# Patient Record
Sex: Male | Born: 1965
Health system: Southern US, Community
[De-identification: ages and names within clinical notes are randomized; demographics above are authoritative.]

## PROBLEM LIST (undated history)

## (undated) DIAGNOSIS — I10 Essential (primary) hypertension: Secondary | ICD-10-CM

## (undated) DIAGNOSIS — F419 Anxiety disorder, unspecified: Secondary | ICD-10-CM

## (undated) DIAGNOSIS — G44009 Cluster headache syndrome, unspecified, not intractable: Secondary | ICD-10-CM

## (undated) DIAGNOSIS — R251 Tremor, unspecified: Secondary | ICD-10-CM

## (undated) HISTORY — PX: WISDOM TOOTH EXTRACTION: SHX21

## (undated) HISTORY — DX: Cluster headache syndrome, unspecified, not intractable: G44.009

## (undated) HISTORY — PX: FINGER SURGERY: SHX640

## (undated) HISTORY — DX: Tremor, unspecified: R25.1

## (undated) HISTORY — DX: Anxiety disorder, unspecified: F41.9

## (undated) HISTORY — PX: VASECTOMY: SHX75

---

## 1998-02-21 ENCOUNTER — Emergency Department (HOSPITAL_COMMUNITY): Admission: EM | Admit: 1998-02-21 | Discharge: 1998-02-21 | Payer: Self-pay | Admitting: Endocrinology

## 1998-12-12 ENCOUNTER — Emergency Department (HOSPITAL_COMMUNITY): Admission: EM | Admit: 1998-12-12 | Discharge: 1998-12-12 | Payer: Self-pay | Admitting: Emergency Medicine

## 1999-02-05 ENCOUNTER — Emergency Department (HOSPITAL_COMMUNITY): Admission: EM | Admit: 1999-02-05 | Discharge: 1999-02-05 | Payer: Self-pay

## 1999-02-11 ENCOUNTER — Emergency Department (HOSPITAL_COMMUNITY): Admission: EM | Admit: 1999-02-11 | Discharge: 1999-02-11 | Payer: Self-pay | Admitting: Emergency Medicine

## 1999-02-16 ENCOUNTER — Emergency Department (HOSPITAL_COMMUNITY): Admission: EM | Admit: 1999-02-16 | Discharge: 1999-02-16 | Payer: Self-pay | Admitting: Emergency Medicine

## 2000-01-05 ENCOUNTER — Emergency Department (HOSPITAL_COMMUNITY): Admission: EM | Admit: 2000-01-05 | Discharge: 2000-01-05 | Payer: Self-pay | Admitting: Emergency Medicine

## 2000-04-15 ENCOUNTER — Emergency Department (HOSPITAL_COMMUNITY): Admission: EM | Admit: 2000-04-15 | Discharge: 2000-04-15 | Payer: Self-pay | Admitting: *Deleted

## 2000-06-15 ENCOUNTER — Emergency Department (HOSPITAL_COMMUNITY): Admission: EM | Admit: 2000-06-15 | Discharge: 2000-06-15 | Payer: Self-pay | Admitting: Emergency Medicine

## 2000-06-15 ENCOUNTER — Encounter: Payer: Self-pay | Admitting: Emergency Medicine

## 2000-08-24 ENCOUNTER — Ambulatory Visit (HOSPITAL_COMMUNITY): Admission: RE | Admit: 2000-08-24 | Discharge: 2000-08-24 | Payer: Self-pay | Admitting: Chiropractic Medicine

## 2000-08-24 ENCOUNTER — Encounter: Payer: Self-pay | Admitting: Chiropractic Medicine

## 2002-03-22 ENCOUNTER — Emergency Department (HOSPITAL_COMMUNITY): Admission: EM | Admit: 2002-03-22 | Discharge: 2002-03-22 | Payer: Self-pay | Admitting: *Deleted

## 2002-03-22 ENCOUNTER — Encounter: Payer: Self-pay | Admitting: *Deleted

## 2004-04-27 ENCOUNTER — Emergency Department (HOSPITAL_COMMUNITY): Admission: EM | Admit: 2004-04-27 | Discharge: 2004-04-28 | Payer: Self-pay | Admitting: Emergency Medicine

## 2004-09-11 ENCOUNTER — Ambulatory Visit: Payer: Self-pay | Admitting: Internal Medicine

## 2005-01-13 ENCOUNTER — Ambulatory Visit: Payer: Self-pay | Admitting: Internal Medicine

## 2005-06-07 ENCOUNTER — Ambulatory Visit: Payer: Self-pay | Admitting: Family Medicine

## 2005-06-23 ENCOUNTER — Ambulatory Visit: Payer: Self-pay | Admitting: Family Medicine

## 2005-09-22 ENCOUNTER — Ambulatory Visit: Payer: Self-pay | Admitting: Family Medicine

## 2006-04-22 ENCOUNTER — Emergency Department (HOSPITAL_COMMUNITY): Admission: EM | Admit: 2006-04-22 | Discharge: 2006-04-22 | Payer: Self-pay | Admitting: Emergency Medicine

## 2008-11-27 ENCOUNTER — Ambulatory Visit (HOSPITAL_COMMUNITY): Admission: RE | Admit: 2008-11-27 | Discharge: 2008-11-27 | Payer: Self-pay | Admitting: Family Medicine

## 2008-12-23 ENCOUNTER — Ambulatory Visit (HOSPITAL_COMMUNITY): Admission: RE | Admit: 2008-12-23 | Discharge: 2008-12-23 | Payer: Self-pay | Admitting: Specialist

## 2009-05-07 ENCOUNTER — Ambulatory Visit (HOSPITAL_COMMUNITY): Admission: RE | Admit: 2009-05-07 | Discharge: 2009-05-07 | Payer: Self-pay | Admitting: Specialist

## 2009-06-25 ENCOUNTER — Emergency Department (HOSPITAL_COMMUNITY): Admission: EM | Admit: 2009-06-25 | Discharge: 2009-06-25 | Payer: Self-pay | Admitting: Family Medicine

## 2009-08-26 ENCOUNTER — Emergency Department (HOSPITAL_COMMUNITY): Admission: EM | Admit: 2009-08-26 | Discharge: 2009-08-26 | Payer: Self-pay | Admitting: Family Medicine

## 2009-12-11 ENCOUNTER — Ambulatory Visit (HOSPITAL_COMMUNITY): Admission: RE | Admit: 2009-12-11 | Discharge: 2009-12-11 | Payer: Self-pay | Admitting: Gastroenterology

## 2010-05-28 ENCOUNTER — Emergency Department (HOSPITAL_COMMUNITY)
Admission: EM | Admit: 2010-05-28 | Discharge: 2010-05-28 | Payer: Self-pay | Source: Home / Self Care | Admitting: Family Medicine

## 2010-08-30 ENCOUNTER — Encounter: Payer: Self-pay | Admitting: Internal Medicine

## 2010-10-26 LAB — DIFFERENTIAL
Basophils Absolute: 0.1 10*3/uL (ref 0.0–0.1)
Basophils Relative: 1 % (ref 0–1)
Eosinophils Absolute: 0.2 10*3/uL (ref 0.0–0.7)
Eosinophils Relative: 2 % (ref 0–5)
Monocytes Absolute: 0.8 10*3/uL (ref 0.1–1.0)
Monocytes Relative: 9 % (ref 3–12)
Neutro Abs: 6 10*3/uL (ref 1.7–7.7)

## 2010-10-26 LAB — COMPREHENSIVE METABOLIC PANEL
ALT: 43 U/L (ref 0–53)
AST: 48 U/L — ABNORMAL HIGH (ref 0–37)
Albumin: 4.3 g/dL (ref 3.5–5.2)
Alkaline Phosphatase: 56 U/L (ref 39–117)
Chloride: 100 mEq/L (ref 96–112)
GFR calc Af Amer: >60 mL/min (ref >60)
Potassium: 4.4 mEq/L (ref 3.5–5.1)
Sodium: 140 mEq/L (ref 135–145)
Total Bilirubin: 1.4 mg/dL — ABNORMAL HIGH (ref 0.3–1.2)
Total Protein: 7.7 g/dL (ref 6.0–8.3)

## 2010-10-26 LAB — POCT H PYLORI SCREEN: H. PYLORI SCREEN, POC: NEGATIVE

## 2010-10-26 LAB — CBC
HCT: 48.6 % (ref 39.0–52.0)
Platelets: 193 10*3/uL (ref 150–400)
RDW: 13.2 % (ref 11.5–15.5)
WBC: 8.8 10*3/uL (ref 4.0–10.5)

## 2011-02-02 ENCOUNTER — Other Ambulatory Visit (HOSPITAL_COMMUNITY): Payer: Self-pay | Admitting: Family Medicine

## 2011-02-02 DIAGNOSIS — IMO0002 Reserved for concepts with insufficient information to code with codable children: Secondary | ICD-10-CM

## 2011-02-03 ENCOUNTER — Inpatient Hospital Stay (HOSPITAL_COMMUNITY)
Admission: RE | Admit: 2011-02-03 | Discharge: 2011-02-03 | Payer: Self-pay | Source: Ambulatory Visit | Attending: Family Medicine | Admitting: Family Medicine

## 2011-02-11 ENCOUNTER — Ambulatory Visit (HOSPITAL_COMMUNITY)
Admission: RE | Admit: 2011-02-11 | Discharge: 2011-02-11 | Disposition: A | Discharge status: Home / Self Care | Payer: 59 | Source: Ambulatory Visit | Attending: Family Medicine | Admitting: Family Medicine

## 2011-02-11 DIAGNOSIS — M5126 Other intervertebral disc displacement, lumbar region: Secondary | ICD-10-CM | POA: Insufficient documentation

## 2011-02-11 DIAGNOSIS — M545 Low back pain, unspecified: Secondary | ICD-10-CM | POA: Insufficient documentation

## 2011-02-11 DIAGNOSIS — M519 Unspecified thoracic, thoracolumbar and lumbosacral intervertebral disc disorder: Secondary | ICD-10-CM | POA: Insufficient documentation

## 2011-02-11 DIAGNOSIS — M79609 Pain in unspecified limb: Secondary | ICD-10-CM | POA: Insufficient documentation

## 2011-02-11 DIAGNOSIS — M51379 Other intervertebral disc degeneration, lumbosacral region without mention of lumbar back pain or lower extremity pain: Secondary | ICD-10-CM | POA: Insufficient documentation

## 2011-02-11 DIAGNOSIS — IMO0002 Reserved for concepts with insufficient information to code with codable children: Secondary | ICD-10-CM

## 2011-02-11 DIAGNOSIS — M5137 Other intervertebral disc degeneration, lumbosacral region: Secondary | ICD-10-CM | POA: Insufficient documentation

## 2012-12-03 ENCOUNTER — Encounter (HOSPITAL_COMMUNITY): Payer: Self-pay | Admitting: Emergency Medicine

## 2012-12-03 ENCOUNTER — Emergency Department (HOSPITAL_COMMUNITY)
Admission: EM | Admit: 2012-12-03 | Discharge: 2012-12-03 | Disposition: A | Discharge status: Home / Self Care | Payer: 59 | Source: Home / Self Care

## 2012-12-03 DIAGNOSIS — N509 Disorder of male genital organs, unspecified: Secondary | ICD-10-CM

## 2012-12-03 DIAGNOSIS — N50812 Left testicular pain: Secondary | ICD-10-CM

## 2012-12-03 DIAGNOSIS — N433 Hydrocele, unspecified: Secondary | ICD-10-CM

## 2012-12-03 HISTORY — DX: Essential (primary) hypertension: I10

## 2012-12-03 LAB — POCT URINALYSIS DIP (DEVICE)
Bilirubin Urine: NEGATIVE
Hgb urine dipstick: NEGATIVE
Leukocytes, UA: NEGATIVE
Nitrite: NEGATIVE
Protein, ur: NEGATIVE mg/dL
pH: 6 (ref 5.0–8.0)

## 2012-12-03 MED ORDER — CIPROFLOXACIN HCL 500 MG PO TABS: 500.0000 mg | ORAL_TABLET | Freq: Two times a day (BID) | ORAL | Status: DC

## 2012-12-03 MED ORDER — TRAMADOL HCL 50 MG PO TABS: 50.0000 mg | ORAL_TABLET | Freq: Four times a day (QID) | ORAL | Status: DC | PRN

## 2012-12-03 NOTE — ED Provider Notes (Signed)
History     CSN: [id number]  Arrival date & time 12/03/12  1148   None     Chief Complaint  Patient presents with  . Testicle Pain    left testicle pain with swelling. denies injury.     (Consider location/radiation/quality/duration/timing/severity/associated sxs/prior treatment) HPI Comments: 47 year old male presents with swelling and mild pain to the left testicle. It began approximately 2 days ago he denies trauma or known injury. He denies urinary symptoms or fever. It is sometimes worse when walking and touching the left testicle.   Past Medical History  Diagnosis Date  . Hypertension     Past Surgical History  Procedure Laterality Date  . Vasectomy      History reviewed. No pertinent family history.  History  Substance Use Topics  . Smoking status: Never Smoker   . Smokeless tobacco: Not on file  . Alcohol Use: No      Review of Systems  Constitutional: Negative.   Respiratory: Negative.   Gastrointestinal: Negative.   Genitourinary: Positive for testicular pain. Negative for dysuria, frequency, hematuria, flank pain, discharge, penile swelling, scrotal swelling, difficulty urinating and penile pain.  Neurological: Negative.     Allergies  Review of patient's allergies indicates no known allergies.  Home Medications   Current Outpatient Rx  Name  Route  Sig  Dispense  Refill  . ClonazePAM (KLONOPIN PO)   Oral   Take by mouth.         Marland Kitchen LISINOPRIL PO   Oral   Take by mouth.         . Multiple Vitamins-Minerals (MULTIVITAMIN PO)   Oral   Take by mouth.         . ciprofloxacin (CIPRO) 500 MG tablet   Oral   Take 1 tablet (500 mg total) by mouth 2 (two) times daily.   20 tablet   0   . traMADol (ULTRAM) 50 MG tablet   Oral   Take 1 tablet (50 mg total) by mouth every 6 (six) hours as needed for pain.   15 tablet   0     There were no vitals taken for this visit.  Physical Exam  Nursing note and vitals  reviewed. Constitutional: He is oriented to person, place, and time. He appears well-developed and well-nourished.  Pulmonary/Chest: Effort normal.  Abdominal: Soft.  Genitourinary:  Penis is normal. Scrotum intact both testicles descended. The right testicle is of normal size and without pain or tenderness. Left testicle has a firm nodule-like structure in the inferior pole which is tender.. The testicle itself is nontender. There is no pain in the penis. There is no discoloration of the scrotum.  Neurological: He is alert and oriented to person, place, and time. He exhibits normal muscle tone.  Skin: Skin is warm and dry.    ED Course  Procedures (including critical care time)  Labs Reviewed  POCT URINALYSIS DIP (DEVICE)   No results found.   1. Hydrocele, right   2. Testicular pain, left       MDM  There is tenderness at the inferior pole of the left testicle with an additional structure was likely representing a hydrocele. No evidence of decreased blood supply based on hands on evaluation and history. We will treat with Cipro 500 mg twice a day and he will followup with his urologist tomorrow. 3 new symptoms problems or worsening may return.         Hayden Rasmussen, NP 12/03/12 1402

## 2012-12-03 NOTE — ED Provider Notes (Signed)
Medical screening examination/treatment/procedure(s) were performed by resident physician or non-physician practitioner and as supervising physician I was immediately available for consultation/collaboration.   KINDL,JAMES DOUGLAS MD.   James D Kindl, MD 12/03/12 1808 

## 2012-12-03 NOTE — ED Notes (Signed)
Pt c/o left testicle pain with swelling x 2 days. Pt states that this has happened before a while back but did not last long. Pt denies injury and or urinary symptoms. Pt has used ibuprofen with no relief in symptoms.

## 2013-02-20 ENCOUNTER — Encounter: Payer: Self-pay | Admitting: Diagnostic Neuroimaging

## 2013-02-20 ENCOUNTER — Ambulatory Visit (INDEPENDENT_AMBULATORY_CARE_PROVIDER_SITE_OTHER): Payer: 59 | Admitting: Diagnostic Neuroimaging

## 2013-02-20 VITALS — BP 114/82 | HR 60 | Temp 97.8°F | Ht 68.0 in | Wt 176.0 lb

## 2013-02-20 DIAGNOSIS — R269 Unspecified abnormalities of gait and mobility: Secondary | ICD-10-CM

## 2013-02-20 DIAGNOSIS — G249 Dystonia, unspecified: Secondary | ICD-10-CM

## 2013-02-20 DIAGNOSIS — R259 Unspecified abnormal involuntary movements: Secondary | ICD-10-CM

## 2013-02-20 NOTE — Patient Instructions (Signed)
I will order MRIs.  Please obtain prior neurology records and test results and bring them to our office.

## 2013-02-20 NOTE — Progress Notes (Signed)
[facility]  PATIENT: Joel Roberts DOB: [birth year]  REFERRING CLINICIAN: Virl Cagey HISTORY FROM: patient REASON FOR VISIT: new consult   HISTORICAL  CHIEF COMPLAINT:  Chief Complaint  Patient presents with  . Tremors  . Fall  . Extremity Weakness    legs    HISTORY OF PRESENT ILLNESS:   47 year old right-handed male here for evaluation of tremor and gait difficulty. For past 4 years patient has had gradual onset, progressive right upper extremity tremor, balance and walking difficulty, falling down. Tremor is mainly present at rest. Tremor initially involved right arm and right leg. Now tremor his involved left side as well. Patient was evaluated in [place], [place] by a neurologist, and then referred to [facility] movement disorder clinic for evaluation (Dr. Seward Meth). Unfortunately I do not have notes or information from these prior neurologic evaluations. Apparently patient had MRI of the brain and lumbar spine, EMG nerve conduction study, DAT scan, as well as other blood testing, including evaluation for Wilson's disease, and given diagnosis of "adult onset dystonia". Patient was tried on trial of carbidopa levodopa without benefit.   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 Recent brain imaging studies demonstrate chronic communicating hydrocephalus.  Patient and his wife are here today looking for a third opinion neuro evaluation.  No family history of similar symptoms.  REVIEW OF SYSTEMS: Full 14 system review of systems performed and notable only for fatigue ringing in ears feeling cold joint pain aching muscle anxiety tremor weakness numbness confusion memory loss.  ALLERGIES: No Known Allergies  HOME MEDICATIONS: Outpatient Prescriptions Prior to Visit  Medication Sig Dispense Refill  . ClonazePAM (KLONOPIN PO) Take by mouth.      Marland Kitchen LISINOPRIL PO Take by mouth.      . Multiple  Vitamins-Minerals (MULTIVITAMIN PO) Take by mouth.      . traMADol (ULTRAM) 50 MG tablet Take 1 tablet (50 mg total) by mouth every 6 (six) hours as needed for pain.  15 tablet  0  . ciprofloxacin (CIPRO) 500 MG tablet Take 1 tablet (500 mg total) by mouth 2 (two) times daily.  20 tablet  0   No facility-administered medications prior to visit.    PAST MEDICAL HISTORY: Past Medical History  Diagnosis Date  . Hypertension     PAST SURGICAL HISTORY: Past Surgical History  Procedure Laterality Date  . Vasectomy      FAMILY HISTORY: Family History  Problem Relation Age of Onset  . Diabetes Mother     SOCIAL HISTORY:  History   Social History  . Marital Status: Married    Spouse Name: French Ana    Number of Children: 1  . Years of Education: College   Occupational History  .  [employer]   Social History Main Topics  . Smoking status: Never Smoker   . Smokeless tobacco: Never Used  . Alcohol Use: No  . Drug Use: No  . Sexually Active: Yes    Birth Control/ Protection: Condom   Other Topics Concern  . Not on file   Social History Narrative   Pt lives at home family.   Caffeine Use: 3 cups daily.     PHYSICAL EXAM  Filed Vitals:   02/20/13 1010  BP: 114/82  Pulse: 60  Temp: 97.8 F (36.6 C)  TempSrc: Oral  Height: 5\' 8"  (1.727 m)  Weight: 176 lb (79.833 kg)    Not recorded    Body  mass index is 26.77 kg/(m^2).  GENERAL EXAM: Patient is in no distress  CARDIOVASCULAR: Regular rate and rhythm, no murmurs, no carotid bruits  NEUROLOGIC: MENTAL STATUS: awake, alert, language fluent, comprehension intact, naming intact CRANIAL NERVE: no papilledema on fundoscopic exam, pupils equal and reactive to light, visual fields full to confrontation, extraocular muscles intact, no nystagmus, facial sensation and strength symmetric, uvula midline, shoulder shrug symmetric, tongue midline. MOTOR: BUE DYSTONIA IN HANDS AND FOREARMS; INT RESTING TREMOR OF BUE. MILD  RIGIDITY; NO BRADYKINESIA. MILD POSTURAL TREMOR RUE > LUE. Full strength in the BUE, BLE SENSORY: normal and symmetric to light touch, pinprick, temperature, vibration COORDINATION: finger-nose-finger, fine finger movements normal REFLEXES: deep tendon reflexes present and symmetric GAIT/STATION: narrow based gait; ABNORMAL, STIFF GAIT. DIFF WITH TOE, HEEL AND TANDEM. TERMOR IN LUE WITH WALKING, WHICH PROGRESSES TO DYSTONIC POSTURING. Romberg is negative   DIAGNOSTIC DATA (LABS, IMAGING, TESTING) - I reviewed patient records, labs, notes, testing and imaging myself where available.  Lab Results  Component Value Date   WBC 8.8 08/26/2009   HGB 16.6 08/26/2009   HCT 48.6 08/26/2009   MCV 95.9 08/26/2009   PLT 193 08/26/2009      Component Value Date/Time   NA 140 08/26/2009 1738   K 4.4 08/26/2009 1738   CL 100 08/26/2009 1738   CO2 34* 08/26/2009 1738   GLUCOSE 89 08/26/2009 1738   BUN 15 08/26/2009 1738   CREATININE 0.95 08/26/2009 1738   CALCIUM 9.3 08/26/2009 1738   PROT 7.7 08/26/2009 1738   ALBUMIN 4.3 08/26/2009 1738   AST 48* 08/26/2009 1738   ALT 43 08/26/2009 1738   ALKPHOS 56 08/26/2009 1738   BILITOT 1.4* 08/26/2009 1738   GFRNONAA >60 08/26/2009 1738   GFRAA  Value: >60        The eGFR has been calculated using the MDRD equation. This calculation has not been validated in all clinical situations. eGFR's persistently <60 mL/min signify possible Chronic Kidney Disease. 08/26/2009 1738   No results found for this basename: CHOL, HDL, LDLCALC, LDLDIRECT, TRIG, CHOLHDL   No results found for this basename: HGBA1C   No results found for this basename: VITAMINB12   No results found for this basename: TSH    12/23/08 MRI brain - moderate ventriculomegaly; scattered non-specific gliosis  02/11/11 MRI lumbar spine - minimal degenerative changes at L3-4 and L5-S1; no spinal stenosis or foraminal narrowing    ASSESSMENT AND PLAN  47 y.o. year old male  has a past medical history of  Hypertension. here with gradual onset progressive resting tremor, dystonic posturing of bilateral upper extremities, gait and balance difficulty. May represent a genetic dystonia syndrome. I will request records of prior evaluation. I will repeat imaging studies. May consider testing for autoimmune, paraneoplastic, metabolic, genetic causes of dystonia and tremor.  PLAN: 1. MRI brain and c-spine 2. Review prior records (patient has already seen a movement disorder's specialist, Dr. Raquel Sarna who is Clinical Director Movement Disorders at [facility])   Orders Placed This Encounter  Procedures  . MR Brain W Wo Contrast  . MR Cervical Spine W Wo Contrast     Suanne Marker, MD 02/20/2013, 11:39 AM Certified in Neurology, Neurophysiology and Neuroimaging  [facility] [address] [place], [place] [address] [phone]

## 2013-05-01 ENCOUNTER — Other Ambulatory Visit (HOSPITAL_COMMUNITY): Payer: Self-pay | Admitting: Urology

## 2013-05-01 DIAGNOSIS — D4959 Neoplasm of unspecified behavior of other genitourinary organ: Secondary | ICD-10-CM

## 2013-05-22 ENCOUNTER — Ambulatory Visit: Payer: 59 | Admitting: Diagnostic Neuroimaging

## 2013-05-25 ENCOUNTER — Ambulatory Visit: Payer: 59 | Admitting: Diagnostic Neuroimaging

## 2013-06-05 ENCOUNTER — Other Ambulatory Visit (HOSPITAL_COMMUNITY): Payer: 59

## 2013-09-02 ENCOUNTER — Emergency Department (HOSPITAL_COMMUNITY)
Admission: EM | Admit: 2013-09-02 | Discharge: 2013-09-02 | Disposition: A | Discharge status: Home / Self Care | Payer: 59 | Source: Home / Self Care | Attending: Family Medicine | Admitting: Family Medicine

## 2013-09-02 ENCOUNTER — Encounter (HOSPITAL_COMMUNITY): Payer: Self-pay | Admitting: Emergency Medicine

## 2013-09-02 DIAGNOSIS — G249 Dystonia, unspecified: Secondary | ICD-10-CM

## 2013-09-02 DIAGNOSIS — R259 Unspecified abnormal involuntary movements: Secondary | ICD-10-CM

## 2013-09-02 DIAGNOSIS — G248 Other dystonia: Secondary | ICD-10-CM

## 2013-09-02 MED ORDER — KETOROLAC TROMETHAMINE 10 MG PO TABS: 10.0000 mg | ORAL_TABLET | Freq: Four times a day (QID) | ORAL | Status: DC | PRN

## 2013-09-02 MED ORDER — KETOROLAC TROMETHAMINE 30 MG/ML IJ SOLN
30.0000 mg | Freq: Once | INTRAMUSCULAR | Status: AC
  Administered 2013-09-02: 30 mg via INTRAMUSCULAR

## 2013-09-02 MED ORDER — KETOROLAC TROMETHAMINE 30 MG/ML IJ SOLN
INTRAMUSCULAR | Status: AC
  Filled 2013-09-02: qty 1

## 2013-09-02 NOTE — Discharge Instructions (Signed)
Contact your doctor on mon for further care plans.

## 2013-09-02 NOTE — ED Notes (Signed)
Pt  Reports  Back  Pain  Lower  In nature           He also   Reports  Some weakness  In his  Lower  Back  And  Has  Been  Falling a  Lot  Recently          He  States  He  Has  A  Bulging  discc in his  Back            Pain is  Worse  When he  Moves    He states  He  [address]  2  Days  Ago         He  States  He  Sees  A  Garment/textile technologist  At  [employer]

## 2013-09-02 NOTE — ED Provider Notes (Signed)
CSN: [id number]     Arrival date & time 09/02/13  1329 History   First MD Initiated Contact with Patient 09/02/13 1433     Chief Complaint  Patient presents with  . Back Pain   (Consider location/radiation/quality/duration/timing/severity/associated sxs/prior Treatment) Patient is a 48 y.o. male presenting with back pain. The history is provided by the patient.  Back Pain Location:  Lumbar spine Quality:  Stabbing Pain severity:  Moderate Chronicity:  Chronic (last seen by [facility] neurologist 36mos ago.) Context comment:  Dx with dystonia, falling spells, undx'd by 3 neurologists in [place] or [place] or [facility]. fell 2 d ago, wanting referral to dr Astrid Drafts., [facility]. Associated symptoms: weakness   Associated symptoms: no abdominal pain, no abdominal swelling, no bladder incontinence and no bowel incontinence     Past Medical History  Diagnosis Date  . Hypertension    Past Surgical History  Procedure Laterality Date  . Vasectomy     Family History  Problem Relation Age of Onset  . Diabetes Mother    History  Substance Use Topics  . Smoking status: Never Smoker   . Smokeless tobacco: Never Used  . Alcohol Use: No    Review of Systems  Gastrointestinal: Negative.  Negative for abdominal pain and bowel incontinence.  Genitourinary: Negative.  Negative for bladder incontinence.  Musculoskeletal: Positive for back pain and gait problem.  Skin: Negative.   Neurological: Positive for weakness.    Allergies  Review of patient's allergies indicates no known allergies.  Home Medications   Current Outpatient Rx  Name  Route  Sig  Dispense  Refill  . ClonazePAM (KLONOPIN PO)   Oral   Take by mouth.         Marland Kitchen ketorolac (TORADOL) 10 MG tablet   Oral   Take 1 tablet (10 mg total) by mouth every 6 (six) hours as needed for moderate pain.   20 tablet   0   . LISINOPRIL PO   Oral   Take by mouth.         . Multiple Vitamins-Minerals (MULTIVITAMIN PO)  Oral   Take by mouth.         . traMADol (ULTRAM) 50 MG tablet   Oral   Take 1 tablet (50 mg total) by mouth every 6 (six) hours as needed for pain.   15 tablet   0    BP 132/78  Pulse 76  Temp(Src) 97.2 F (36.2 C) (Oral)  Resp 18  SpO2 100% Physical Exam  Nursing note and vitals reviewed. Constitutional: He is oriented to person, place, and time. He appears well-developed and well-nourished.  Musculoskeletal:       Lumbar back: He exhibits tenderness. He exhibits normal range of motion, no pain and no spasm.  Neurological: He is alert and oriented to person, place, and time. No cranial nerve deficit. Coordination normal.  Skin: Skin is warm and dry.    ED Course  Procedures (including critical care time) Labs Review Labs Reviewed - No data to display Imaging Review No results found.  EKG Interpretation    Date/Time:    Ventricular Rate:    PR Interval:    QRS Duration:   QT Interval:    QTC Calculation:   R Axis:     Text Interpretation:              MDM      Billy Fischer, MD 09/02/13 1527

## 2013-09-12 ENCOUNTER — Telehealth: Payer: Self-pay | Admitting: Neurology

## 2013-09-12 NOTE — Telephone Encounter (Signed)
Spoke with patient's wife. MR Brain was last done in 2012.

## 2013-09-14 ENCOUNTER — Encounter: Payer: Self-pay | Admitting: Neurology

## 2013-09-14 ENCOUNTER — Ambulatory Visit (INDEPENDENT_AMBULATORY_CARE_PROVIDER_SITE_OTHER): Payer: 59 | Admitting: Neurology

## 2013-09-14 VITALS — BP 154/88 | HR 64 | Resp 16 | Ht 69.0 in | Wt 179.2 lb

## 2013-09-14 DIAGNOSIS — F449 Dissociative and conversion disorder, unspecified: Secondary | ICD-10-CM

## 2013-09-14 DIAGNOSIS — G252 Other specified forms of tremor: Secondary | ICD-10-CM

## 2013-09-14 DIAGNOSIS — R259 Unspecified abnormal involuntary movements: Secondary | ICD-10-CM

## 2013-09-14 DIAGNOSIS — R269 Unspecified abnormalities of gait and mobility: Secondary | ICD-10-CM

## 2013-09-14 NOTE — Patient Instructions (Signed)
We will contact you about a referral.
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
## 2013-09-20 ENCOUNTER — Telehealth: Payer: Self-pay | Admitting: Neurology

## 2013-09-20 DIAGNOSIS — F444 Conversion disorder with motor symptom or deficit: Secondary | ICD-10-CM

## 2013-09-20 NOTE — Telephone Encounter (Signed)
Patient made aware I have sent the referral and they will be calling with an appt.

## 2013-09-20 NOTE — Telephone Encounter (Signed)
Referral faxed to Dr Tobey Bride in [place] at [phone] for treatment of psychogenic tremor.

## 2013-10-12 ENCOUNTER — Other Ambulatory Visit (HOSPITAL_COMMUNITY): Payer: Self-pay | Admitting: Neurosurgery

## 2013-10-12 DIAGNOSIS — M5137 Other intervertebral disc degeneration, lumbosacral region: Secondary | ICD-10-CM

## 2013-10-17 ENCOUNTER — Telehealth: Payer: Self-pay | Admitting: Neurology

## 2013-10-17 NOTE — Telephone Encounter (Signed)
Called patient to follow up on referral to Dr Tobey Bride. I spoke with patient's wife who states they did contact him to set up an appt but they are not in network with his insurance so they did not follow through with making an appt. She will contact her insurance and see if they can find an MD covered by his insurance. She will call us back if she needs assistance from [place].

## 2013-10-22 ENCOUNTER — Ambulatory Visit (HOSPITAL_COMMUNITY): Payer: 59

## 2013-10-25 ENCOUNTER — Ambulatory Visit (HOSPITAL_COMMUNITY)
Admission: RE | Admit: 2013-10-25 | Discharge: 2013-10-25 | Disposition: A | Discharge status: Home / Self Care | Payer: 59 | Source: Ambulatory Visit | Attending: Neurosurgery | Admitting: Neurosurgery

## 2013-10-25 DIAGNOSIS — M545 Low back pain, unspecified: Secondary | ICD-10-CM | POA: Insufficient documentation

## 2013-10-25 DIAGNOSIS — M5126 Other intervertebral disc displacement, lumbar region: Secondary | ICD-10-CM | POA: Insufficient documentation

## 2013-10-25 DIAGNOSIS — M47817 Spondylosis without myelopathy or radiculopathy, lumbosacral region: Secondary | ICD-10-CM | POA: Insufficient documentation

## 2013-10-25 DIAGNOSIS — M5137 Other intervertebral disc degeneration, lumbosacral region: Secondary | ICD-10-CM

## 2014-06-22 ENCOUNTER — Emergency Department (HOSPITAL_COMMUNITY): Payer: 59

## 2014-06-22 ENCOUNTER — Encounter (HOSPITAL_COMMUNITY): Payer: Self-pay | Admitting: *Deleted

## 2014-06-22 ENCOUNTER — Emergency Department (HOSPITAL_COMMUNITY)
Admission: EM | Admit: 2014-06-22 | Discharge: 2014-06-22 | Disposition: A | Discharge status: Home / Self Care | Payer: 59 | Attending: Emergency Medicine | Admitting: Emergency Medicine

## 2014-06-22 DIAGNOSIS — S99912A Unspecified injury of left ankle, initial encounter: Secondary | ICD-10-CM | POA: Diagnosis present

## 2014-06-22 DIAGNOSIS — F419 Anxiety disorder, unspecified: Secondary | ICD-10-CM | POA: Insufficient documentation

## 2014-06-22 DIAGNOSIS — Y998 Other external cause status: Secondary | ICD-10-CM | POA: Diagnosis not present

## 2014-06-22 DIAGNOSIS — S93402A Sprain of unspecified ligament of left ankle, initial encounter: Secondary | ICD-10-CM | POA: Insufficient documentation

## 2014-06-22 DIAGNOSIS — W010XXA Fall on same level from slipping, tripping and stumbling without subsequent striking against object, initial encounter: Secondary | ICD-10-CM | POA: Insufficient documentation

## 2014-06-22 DIAGNOSIS — Y9389 Activity, other specified: Secondary | ICD-10-CM | POA: Diagnosis not present

## 2014-06-22 DIAGNOSIS — I10 Essential (primary) hypertension: Secondary | ICD-10-CM | POA: Insufficient documentation

## 2014-06-22 DIAGNOSIS — Y9289 Other specified places as the place of occurrence of the external cause: Secondary | ICD-10-CM | POA: Diagnosis not present

## 2014-06-22 DIAGNOSIS — W19XXXA Unspecified fall, initial encounter: Secondary | ICD-10-CM

## 2014-06-22 DIAGNOSIS — Z79899 Other long term (current) drug therapy: Secondary | ICD-10-CM | POA: Insufficient documentation

## 2014-06-22 MED ORDER — HYDROCODONE-ACETAMINOPHEN 5-325 MG PO TABS
2.0000 | ORAL_TABLET | Freq: Once | ORAL | Status: AC
  Administered 2014-06-22: 2 via ORAL
  Filled 2014-06-22: qty 2

## 2014-06-22 NOTE — Discharge Instructions (Signed)
°Ankle Sprain °An ankle sprain is an injury to the strong, fibrous tissues (ligaments) that hold the bones of your ankle joint together.  °CAUSES °An ankle sprain is usually caused by a fall or by twisting your ankle. Ankle sprains most commonly occur when you step on the outer edge of your foot, and your ankle turns inward. People who participate in sports are more prone to these types of injuries.  °SYMPTOMS  °· Pain in your ankle. The pain may be present at rest or only when you are trying to stand or walk. °· Swelling. °· Bruising. Bruising may develop immediately or within 1 to 2 days after your injury. °· Difficulty standing or walking, particularly when turning corners or changing directions. °DIAGNOSIS  °Your caregiver will ask you details about your injury and perform a physical exam of your ankle to determine if you have an ankle sprain. During the physical exam, your caregiver will press on and apply pressure to specific areas of your foot and ankle. Your caregiver will try to move your ankle in certain ways. An X-ray exam may be done to be sure a bone was not broken or a ligament did not separate from one of the bones in your ankle (avulsion fracture).  °TREATMENT  °Certain types of braces can help stabilize your ankle. Your caregiver can make a recommendation for this. Your caregiver may recommend the use of medicine for pain. If your sprain is severe, your caregiver may refer you to a surgeon who helps to restore function to parts of your skeletal system (orthopedist) or a physical therapist. °HOME CARE INSTRUCTIONS  °· Apply ice to your injury for 1-2 days or as directed by your caregiver. Applying ice helps to reduce inflammation and pain. °¨ Put ice in a plastic bag. °¨ Place a towel between your skin and the bag. °¨ Leave the ice on for 15-20 minutes at a time, every 2 hours while you are awake. °· Only take over-the-counter or prescription medicines for pain, discomfort, or fever as directed by  your caregiver. °· Elevate your injured ankle above the level of your heart as much as possible for 2-3 days. °· If your caregiver recommends crutches, use them as instructed. Gradually put weight on the affected ankle. Continue to use crutches or a cane until you can walk without feeling pain in your ankle. °· If you have a plaster splint, wear the splint as directed by your caregiver. Do not rest it on anything harder than a pillow for the first 24 hours. Do not put weight on it. Do not get it wet. You may take it off to take a shower or bath. °· You may have been given an elastic bandage to wear around your ankle to provide support. If the elastic bandage is too tight (you have numbness or tingling in your foot or your foot becomes cold and blue), adjust the bandage to make it comfortable. °· If you have an air splint, you may blow more air into it or let air out to make it more comfortable. You may take your splint off at night and before taking a shower or bath. Wiggle your toes in the splint several times per day to decrease swelling. °SEEK MEDICAL CARE IF:  °· You have rapidly increasing bruising or swelling. °· Your toes feel extremely cold or you lose feeling in your foot. °· Your pain is not relieved with medicine. °SEEK IMMEDIATE MEDICAL CARE IF: °· Your toes are numb or blue. °·   You have severe pain that is increasing. MAKE SURE YOU:   Understand these instructions.  Will watch your condition.  Will get help right away if you are not doing well or get worse. Document Released: 07/26/2005 Document Revised: 04/19/2012 Document Reviewed: 08/07/2011 [facility] Patient Information 2015 [place], [place]. This information is not intended to replace advice given to you by your health care provider. Make sure you discuss any questions you have with your health care provider. Elastic Bandage and RICE Elastic bandages come in different shapes and sizes. They perform different functions. Your caregiver will  help you to decide what is best for your protection, recovery, or rehabilitation following an injury. The following are some general tips to help you use an elastic bandage.  Use the bandage as directed by the maker of the bandage you are using.  Do not wrap it too tight. This may cut off the circulation of the arm or leg below the bandage.  If part of your body beyond the bandage becomes blue, numb, or swollen, it is too tight. Loosen the bandage as needed to prevent these problems.  See your caregiver or trainer if the bandage seems to be making your problems worse rather than better. Bandages may be a reminder to you that you have an injury. However, they provide very little support. The few pounds of support they provide are minor considering the pressure it takes to injure a joint or tear ligaments. Therefore, the joint will not be able to handle all of the wear and tear it could before the injury. The routine care of many injuries includes Rest, Ice, Compression, and Elevation (RICE).  Rest is required to allow your body to heal. Generally, routine activities can be resumed when comfortable. Injured tendons and bones take about 6 weeks to heal.  Icing the injury helps keep the swelling down and reduces pain. Do not apply ice directly to the skin. Put ice in a plastic bag. Place a towel between the skin and the bag. This will prevent frostbite to the skin. Apply ice bags to the injured area for 15-20 minutes, every 2 hours while awake. Do this for the first 24 to 48 hours, then as directed by your caregiver.  Compression helps keep swelling down, gives support, and helps with discomfort. If an elastic bandage has been applied today, it should be removed and reapplied every 3 to 4 hours. It should not be applied tightly, but firmly enough to keep swelling down. Watch fingers or toes for swelling, bluish discoloration, coldness, numbness, or increased pain. If any of these problems occur, remove  the bandage and reapply it more loosely. If these problems persist, contact your caregiver.  Elevation helps reduce swelling and decreases pain. The injured area (arms, hands, legs, or feet) should be placed near to or above the heart (center of the chest) if able. Persistent pain and inability to use the injured area for more than 2 to 3 days are warning signs. You should see a caregiver for a follow-up visit as soon as possible. Initially, a minor broken bone (hairline fracture) may not be seen on X-rays. It may take 7 to 10 days to finally show up. Continued pain and swelling show that further evaluation and/or X-rays are needed. Make a follow-up visit with your caregiver. A specialist in reading X-rays (radiologist) will read your X-rays again. Finding out the results of your test Not all test results are available during your visit. If your test results are not  back during the visit, make an appointment with your caregiver to find out the results. Do not assume everything is normal if you have not heard from your caregiver or the medical facility. It is important for you to follow up on all of your test results. Document Released: 01/15/2002 Document Revised: 10/18/2011 Document Reviewed: 11/27/2007 [facility] Patient Information 2015 [place], [place]. This information is not intended to replace advice given to you by your health care provider. Make sure you discuss any questions you have with your health care provider.

## 2014-06-22 NOTE — ED Provider Notes (Signed)
CSN: [id number]     Arrival date & time 06/22/14  1906 History   First MD Initiated Contact with Patient 06/22/14 1942     Chief Complaint  Patient presents with  . Ankle Injury     (Consider location/radiation/quality/duration/timing/severity/associated sxs/prior Treatment) HPI Comments: This is a 48 year old male who presents to the emergency department complaining of left ankle pain after accidentally slipping and twisting his left ankle around 6:30 PM tonight. He states he feels like his ankle is swelling. Pain is worse with any movement, slightly relieved by a Cam Walker that he had at home. No medications taken prior to arrival. Denies numbness or tingling.  Patient is a 48 y.o. male presenting with lower extremity injury. The history is provided by the patient.  Ankle Injury Pertinent negatives include no numbness.    Past Medical History  Diagnosis Date  . Hypertension   . Tremor   . Anxiety   . Cluster headaches    Past Surgical History  Procedure Laterality Date  . Vasectomy     Family History  Problem Relation Age of Onset  . Diabetes Maternal Grandmother    History  Substance Use Topics  . Smoking status: Never Smoker   . Smokeless tobacco: Never Used  . Alcohol Use: No     Comment: Hx EtOH abuse - quit drinking 7 years ago    Review of Systems  Constitutional: Negative.   HENT: Negative.   Respiratory: Negative.   Cardiovascular: Negative.   Musculoskeletal:       + L ankle pain.  Skin: Negative.   Neurological: Negative for numbness.      Allergies  Review of patient's allergies indicates no known allergies.  Home Medications   Prior to Admission medications   Medication Sig Start Date End Date Taking? Authorizing Provider  acetaminophen (TYLENOL) 500 MG tablet Take 500 mg by mouth every 6 (six) hours as needed for moderate pain.   Yes Historical Provider, MD  clonazePAM (KLONOPIN) 0.5 MG tablet Take 0.5 mg by mouth 2 (two) times daily as  needed for anxiety.   Yes Historical Provider, MD  esomeprazole (NEXIUM) 40 MG capsule Take 40 mg by mouth daily at 12 noon.   Yes Historical Provider, MD  ibuprofen (ADVIL,MOTRIN) 200 MG tablet Take 200 mg by mouth every 6 (six) hours as needed for headache.   Yes Historical Provider, MD  lisinopril (PRINIVIL,ZESTRIL) 20 MG tablet Take 20 mg by mouth daily.   Yes Historical Provider, MD  Multiple Vitamin (MULTIVITAMIN WITH MINERALS) TABS tablet Take 1 tablet by mouth daily.   Yes Historical Provider, MD   BP 145/103 mmHg  Pulse 86  Temp(Src) 98.2 F (36.8 C) (Oral)  Resp 16  Ht 5\' 9"  (1.753 m)  Wt 174 lb (78.926 kg)  BMI 25.68 kg/m2  SpO2 100% Physical Exam  Constitutional: He is oriented to person, place, and time. He appears well-developed and well-nourished. No distress.  HENT:  Head: Normocephalic and atraumatic.  Eyes: Conjunctivae and EOM are normal.  Neck: Normal range of motion. Neck supple.  Cardiovascular: Normal rate, regular rhythm and normal heart sounds.   Pulmonary/Chest: Effort normal and breath sounds normal.  Musculoskeletal:  L ankle TTP over distal fibula and over AITFL with mild swelling. No deformity. ROM limited by pain. No tenderness at proximal fibula. +2 DP/PT pulse. Sensation intact.  Neurological: He is alert and oriented to person, place, and time.  Skin: Skin is warm and dry.  Psychiatric: He has a  normal mood and affect. His behavior is normal.  Nursing note and vitals reviewed.   ED Course  Procedures (including critical care time) Labs Review Labs Reviewed - No data to display  Imaging Review Dg Ankle Complete Left  06/22/2014   CLINICAL DATA:  Slipped on leads in fell today.  Left ankle pain.  EXAM: LEFT ANKLE COMPLETE - 3+ VIEW  COMPARISON:  None.  FINDINGS: There is no evidence of fracture, dislocation, or joint effusion. There is no evidence of arthropathy or other focal bone abnormality. Soft tissues are unremarkable.  IMPRESSION: No acute  osseous injury of the left ankle.   Electronically Signed   By: Kathreen Devoid   On: 06/22/2014 21:04     EKG Interpretation None      MDM   Final diagnoses:  Left ankle sprain, initial encounter   Neurovascularly intact. No deformity. Xray without acute finding. He already has cam walker. Advised RICE, NSAIDs. Stable for d/c. Return precautions given. Patient states understanding of treatment care plan and is agreeable.  Carman Ching, PA-C 06/22/14 2138  Ephraim Hamburger, MD 06/23/14 [phone]

## 2014-06-22 NOTE — ED Notes (Signed)
Pt states he slipped outside and heard and felt a pop in his left angle. Pt has cam walker applied to left ankle prior to arrival. CMS intact.

## 2014-10-18 ENCOUNTER — Ambulatory Visit (INDEPENDENT_AMBULATORY_CARE_PROVIDER_SITE_OTHER): Payer: 59 | Admitting: Internal Medicine

## 2014-10-18 VITALS — BP 114/80 | HR 69 | Temp 97.7°F | Resp 16 | Ht 68.5 in | Wt 182.0 lb

## 2014-10-18 DIAGNOSIS — J069 Acute upper respiratory infection, unspecified: Secondary | ICD-10-CM

## 2014-10-18 DIAGNOSIS — R0981 Nasal congestion: Secondary | ICD-10-CM | POA: Diagnosis not present

## 2014-10-18 DIAGNOSIS — R070 Pain in throat: Secondary | ICD-10-CM

## 2014-10-18 LAB — POCT RAPID STREP A (OFFICE): RAPID STREP A SCREEN: NEGATIVE

## 2014-10-18 MED ORDER — GUAIFENESIN ER 1200 MG PO TB12: 1.0000 | ORAL_TABLET | Freq: Two times a day (BID) | ORAL | Status: DC | PRN

## 2014-10-18 MED ORDER — IPRATROPIUM BROMIDE 0.03 % NA SOLN: 2.0000 | Freq: Two times a day (BID) | NASAL | Status: DC

## 2014-10-18 MED ORDER — MAGIC MOUTHWASH W/LIDOCAINE: 5.0000 mL | Freq: Four times a day (QID) | ORAL | Status: DC | PRN

## 2014-10-18 NOTE — Patient Instructions (Signed)
-Drink plenty of water (64oz per day=4 regular sized water bottles)  Upper Respiratory Infection, Adult An upper respiratory infection (URI) is also sometimes known as the common cold. The upper respiratory tract includes the nose, sinuses, throat, trachea, and bronchi. Bronchi are the airways leading to the lungs. Most people improve within 1 week, but symptoms can last up to 2 weeks. A residual cough may last even longer.  CAUSES Many different viruses can infect the tissues lining the upper respiratory tract. The tissues become irritated and inflamed and often become very moist. Mucus production is also common. A cold is contagious. You can easily spread the virus to others by oral contact. This includes kissing, sharing a glass, coughing, or sneezing. Touching your mouth or nose and then touching a surface, which is then touched by another person, can also spread the virus. SYMPTOMS  Symptoms typically develop 1 to 3 days after you come in contact with a cold virus. Symptoms vary from person to person. They may include:  Runny nose.  Sneezing.  Nasal congestion.  Sinus irritation.  Sore throat.  Loss of voice (laryngitis).  Cough.  Fatigue.  Muscle aches.  Loss of appetite.  Headache.  Low-grade fever. DIAGNOSIS  You might diagnose your own cold based on familiar symptoms, since most people get a cold 2 to 3 times a year. Your caregiver can confirm this based on your exam. Most importantly, your caregiver can check that your symptoms are not due to another disease such as strep throat, sinusitis, pneumonia, asthma, or epiglottitis. Blood tests, throat tests, and X-rays are not necessary to diagnose a common cold, but they may sometimes be helpful in excluding other more serious diseases. Your caregiver will decide if any further tests are required. RISKS AND COMPLICATIONS  You may be at risk for a more severe case of the common cold if you smoke cigarettes, have chronic heart  disease (such as heart failure) or lung disease (such as asthma), or if you have a weakened immune system. The very young and very old are also at risk for more serious infections. Bacterial sinusitis, middle ear infections, and bacterial pneumonia can complicate the common cold. The common cold can worsen asthma and chronic obstructive pulmonary disease (COPD). Sometimes, these complications can require emergency medical care and may be life-threatening. PREVENTION  The best way to protect against getting a cold is to practice good hygiene. Avoid oral or hand contact with people with cold symptoms. Wash your hands often if contact occurs. There is no clear evidence that vitamin C, vitamin E, echinacea, or exercise reduces the chance of developing a cold. However, it is always recommended to get plenty of rest and practice good nutrition. TREATMENT  Treatment is directed at relieving symptoms. There is no cure. Antibiotics are not effective, because the infection is caused by a virus, not by bacteria. Treatment may include:  Increased fluid intake. Sports drinks offer valuable electrolytes, sugars, and fluids.  Breathing heated mist or steam (vaporizer or shower).  Eating chicken soup or other clear broths, and maintaining good nutrition.  Getting plenty of rest.  Using gargles or lozenges for comfort.  Controlling fevers with ibuprofen or acetaminophen as directed by your caregiver.  Increasing usage of your inhaler if you have asthma. Zinc gel and zinc lozenges, taken in the first 24 hours of the common cold, can shorten the duration and lessen the severity of symptoms. Pain medicines may help with fever, muscle aches, and throat pain. A variety of  non-prescription medicines are available to treat congestion and runny nose. Your caregiver can make recommendations and may suggest nasal or lung inhalers for other symptoms.  HOME CARE INSTRUCTIONS   Only take over-the-counter or prescription  medicines for pain, discomfort, or fever as directed by your caregiver.  Use a warm mist humidifier or inhale steam from a shower to increase air moisture. This may keep secretions moist and make it easier to breathe.  Drink enough water and fluids to keep your urine clear or pale yellow.  Rest as needed.  Return to work when your temperature has returned to normal or as your caregiver advises. You may need to stay home longer to avoid infecting others. You can also use a face mask and careful hand washing to prevent spread of the virus. SEEK MEDICAL CARE IF:   After the first few days, you feel you are getting worse rather than better.  You need your caregiver's advice about medicines to control symptoms.  You develop chills, worsening shortness of breath, or brown or red sputum. These may be signs of pneumonia.  You develop yellow or brown nasal discharge or pain in the face, especially when you bend forward. These may be signs of sinusitis.  You develop a fever, swollen neck glands, pain with swallowing, or white areas in the back of your throat. These may be signs of strep throat. SEEK IMMEDIATE MEDICAL CARE IF:   You have a fever.  You develop severe or persistent headache, ear pain, sinus pain, or chest pain.  You develop wheezing, a prolonged cough, cough up blood, or have a change in your usual mucus (if you have chronic lung disease).  You develop sore muscles or a stiff neck. Document Released: 01/19/2001 Document Revised: 10/18/2011 Document Reviewed: 10/31/2013 [facility] Patient Information 2015 [place], [place]. This information is not intended to replace advice given to you by your health care provider. Make sure you discuss any questions you have with your health care provider.

## 2014-10-18 NOTE — Progress Notes (Signed)
[facility] [address] [place] [address] [phone]  Date:  10/18/2014   Name:  Joel Roberts   DOB:  07/15/1966   MRN:  [id number]  PCP:  Myrlene Broker, MD    Chief Complaint: Sore Throat   History of Present Illness:  Joel Roberts is a 49 y.o. very pleasant male patient who presents with the following:   Patient reports 3 days of scratchy throat.  Rates the pain 3/10.  He has some ear fullness without pain, congestion, and post nasal drip.  This includes maxillary and temple pressure.  He has no dyspnea or sob.  He reports no GI symptoms of abdominal pain or diarrhea.  Ibuprofen or tylenol for pain.  He works around many sick contacts with similar symptoms.  He has tried tylenol which has helped some.     Patient Active Problem List   Diagnosis Date Noted  . Gait difficulty 02/20/2013  . Resting tremor 02/20/2013    Past Medical History  Diagnosis Date  . Hypertension   . Tremor   . Anxiety   . Cluster headaches     Past Surgical History  Procedure Laterality Date  . Vasectomy      History  Substance Use Topics  . Smoking status: Never Smoker   . Smokeless tobacco: Never Used  . Alcohol Use: No     Comment: Hx EtOH abuse - quit drinking 7 years ago    Family History  Problem Relation Age of Onset  . Diabetes Maternal Grandmother   . Hypertension Mother     No Known Allergies  Medication list has been reviewed and updated.  Current Outpatient Prescriptions on File Prior to Visit  Medication Sig Dispense Refill  . acetaminophen (TYLENOL) 500 MG tablet Take 500 mg by mouth every 6 (six) hours as needed for moderate pain.    . clonazePAM (KLONOPIN) 0.5 MG tablet Take 0.5 mg by mouth 2 (two) times daily as needed for anxiety.    Marland Kitchen esomeprazole (NEXIUM) 40 MG capsule Take 40 mg by mouth daily at 12 noon.    Marland Kitchen ibuprofen (ADVIL,MOTRIN) 200 MG tablet Take 200 mg by mouth every 6 (six) hours as needed for headache.    .  lisinopril (PRINIVIL,ZESTRIL) 20 MG tablet Take 20 mg by mouth daily.    . Multiple Vitamin (MULTIVITAMIN WITH MINERALS) TABS tablet Take 1 tablet by mouth daily.     No current facility-administered medications on file prior to visit.    Review of Systems: ROS otherwise unremarkable unless listed above.    Physical Examination: Filed Vitals:   10/18/14 0952  BP: 114/80  Pulse: 69  Temp: 97.7 F (36.5 C)  Resp: 16   Filed Vitals:   10/18/14 0952  Height: 5' 8.5" (1.74 m)  Weight: 182 lb (82.555 kg)   Body mass index is 27.27 kg/(m^2). Ideal Body Weight: Weight in (lb) to have BMI = 25: 166.5  Physical Exam  Constitutional: He is oriented to person, place, and time. He appears well-developed and well-nourished. No distress.  HENT:  Head: Normocephalic and atraumatic.  Right Ear: External ear and ear canal normal.  Left Ear: External ear and ear canal normal.  Nose: No mucosal edema or rhinorrhea.  Mouth/Throat: Uvula is midline. Posterior oropharyngeal erythema (Mild) present. No oropharyngeal exudate.  TM bilaterally with mild scarring.   Tonsillar cryptic debris at the right tonsil.  Eyes: Conjunctivae and EOM are normal. Pupils are equal, round,  and reactive to light. Right eye exhibits no discharge. Left eye exhibits no discharge. Right conjunctiva is not injected. Right conjunctiva has no hemorrhage. Left conjunctiva is not injected. Left conjunctiva has no hemorrhage.  Neck: Normal range of motion. Neck supple.  Cardiovascular: Normal rate and regular rhythm.  Exam reveals no friction rub.   No murmur heard. Pulmonary/Chest: Effort normal and breath sounds normal. No respiratory distress. He has no wheezes.  Lymphadenopathy:    He has no cervical adenopathy.  Neurological: He is alert and oriented to person, place, and time.  Skin: Skin is warm and dry.  Psychiatric: He has a normal mood and affect. His behavior is normal.    Results for orders placed or performed  in visit on 10/18/14  POCT rapid strep A  Result Value Ref Range   Rapid Strep A Screen Negative Negative    Assessment and Plan: 49 year old male is here today for chief complaint of sore throat and congestion.  Treating supportively for upper respiratory, viral in etiology.  If similar symptoms worsen within the next 6 days and/or culture positive, will treat with amoxicillin.    Throat pain - Plan: POCT rapid strep A, Throat culture ([facility]), Alum & Mag Hydroxide-Simeth (MAGIC MOUTHWASH W/LIDOCAINE) SOLN  Nasal congestion - Plan: ipratropium (ATROVENT) 0.03 % nasal spray, Guaifenesin (MUCINEX MAXIMUM STRENGTH) 1200 MG TB12  Upper respiratory infection, viral - Plan: ipratropium (ATROVENT) 0.03 % nasal spray, Guaifenesin (MUCINEX MAXIMUM STRENGTH) 1200 MG TB12, Alum & Mag Hydroxide-Simeth (MAGIC MOUTHWASH W/LIDOCAINE) SOLN  [name], PA-C [facility] 3/11/20169:24 PM

## 2014-10-19 LAB — CULTURE, GROUP A STREP: Organism ID, Bacteria: NORMAL

## 2014-10-27 ENCOUNTER — Emergency Department (HOSPITAL_COMMUNITY)
Admission: EM | Admit: 2014-10-27 | Discharge: 2014-10-27 | Disposition: A | Discharge status: Home / Self Care | Payer: 59 | Attending: Emergency Medicine | Admitting: Emergency Medicine

## 2014-10-27 ENCOUNTER — Emergency Department (HOSPITAL_COMMUNITY): Payer: 59

## 2014-10-27 ENCOUNTER — Encounter (HOSPITAL_COMMUNITY): Payer: Self-pay | Admitting: Emergency Medicine

## 2014-10-27 DIAGNOSIS — I10 Essential (primary) hypertension: Secondary | ICD-10-CM | POA: Diagnosis not present

## 2014-10-27 DIAGNOSIS — M545 Low back pain, unspecified: Secondary | ICD-10-CM

## 2014-10-27 DIAGNOSIS — F419 Anxiety disorder, unspecified: Secondary | ICD-10-CM | POA: Insufficient documentation

## 2014-10-27 DIAGNOSIS — Z8669 Personal history of other diseases of the nervous system and sense organs: Secondary | ICD-10-CM | POA: Diagnosis not present

## 2014-10-27 DIAGNOSIS — Z79899 Other long term (current) drug therapy: Secondary | ICD-10-CM | POA: Diagnosis not present

## 2014-10-27 MED ORDER — CYCLOBENZAPRINE HCL 10 MG PO TABS: 10.0000 mg | ORAL_TABLET | Freq: Three times a day (TID) | ORAL | Status: DC | PRN

## 2014-10-27 MED ORDER — HYDROCODONE-ACETAMINOPHEN 5-325 MG PO TABS: 1.0000 | ORAL_TABLET | ORAL | Status: DC | PRN

## 2014-10-27 MED ORDER — DIAZEPAM 5 MG PO TABS
5.0000 mg | ORAL_TABLET | Freq: Once | ORAL | Status: AC
Start: 2014-10-27 — End: 2014-10-27
  Administered 2014-10-27: 5 mg via ORAL
  Filled 2014-10-27: qty 1

## 2014-10-27 MED ORDER — OXYCODONE-ACETAMINOPHEN 5-325 MG PO TABS
2.0000 | ORAL_TABLET | Freq: Once | ORAL | Status: AC
  Administered 2014-10-27: 2 via ORAL
  Filled 2014-10-27: qty 2

## 2014-10-27 NOTE — ED Provider Notes (Signed)
CSN: [id number]     Arrival date & time 10/27/14  1617 History  This chart was scribed for non-physician practitioner, Lucien Mons, PA-C,working with Noemi Chapel, MD, by Marlowe Kays, ED Scribe. This patient was seen in room TR06C/TR06C and the patient's care was started at 4:44 PM.  Chief Complaint  Patient presents with  . Back Pain   Patient is a 49 y.o. male presenting with back pain. The history is provided by the patient and medical records. No language interpreter was used.  Back Pain Associated symptoms: no fever, no numbness and no weakness     HPI Comments:  Joel Roberts is a 49 y.o. male with PMHx of bulging disc who presents to the Emergency Department complaining of severe, constant stabbing lower back pain that began three days ago. He states the pain radiates down the back of his left leg. He reports falling randomly 3-4 times the past couple of days. Pt states his back feels like it is "catching" when he tries to rise to a standing position. Denies alleviating factors. He reports taking Ibuprofen with no significant relief of the pain. Denies fever, chills, bowel or bladder incontinence, bruising, wounds, numbness, tingling or weakness of the lower extremities. PMHx of HTN, anxiety, tremors and cluster headaches. PCP is Dr. Unk Lightning. Neurologist is in [place].  Past Medical History  Diagnosis Date  . Hypertension   . Tremor   . Anxiety   . Cluster headaches    Past Surgical History  Procedure Laterality Date  . Vasectomy     Family History  Problem Relation Age of Onset  . Diabetes Maternal Grandmother   . Hypertension Mother    History  Substance Use Topics  . Smoking status: Never Smoker   . Smokeless tobacco: Never Used  . Alcohol Use: No     Comment: Hx EtOH abuse - quit drinking 7 years ago    Review of Systems  Constitutional: Negative for fever and chills.  Genitourinary:       No bowel or bladder incontinence.  Musculoskeletal: Positive for  back pain.  Skin: Negative for color change and wound.  Neurological: Negative for weakness and numbness.  All other systems reviewed and are negative.   Allergies  Review of patient's allergies indicates no known allergies.  Home Medications   Prior to Admission medications   Medication Sig Start Date End Date Taking? Authorizing Provider  acetaminophen (TYLENOL) 500 MG tablet Take 500 mg by mouth every 6 (six) hours as needed for moderate pain.    Historical Provider, MD  Alum & Mag Hydroxide-Simeth (MAGIC MOUTHWASH W/LIDOCAINE) SOLN Take 5 mLs by mouth 4 (four) times daily as needed for mouth pain. 10/18/14   Dorian Heckle English, PA  clonazePAM (KLONOPIN) 0.5 MG tablet Take 0.5 mg by mouth 2 (two) times daily as needed for anxiety.    Historical Provider, MD  cyclobenzaprine (FLEXERIL) 10 MG tablet Take 1 tablet (10 mg total) by mouth every 8 (eight) hours as needed for muscle spasms. 10/27/14   Carman Ching, PA-C  esomeprazole (NEXIUM) 40 MG capsule Take 40 mg by mouth daily at 12 noon.    Historical Provider, MD  Guaifenesin (MUCINEX MAXIMUM STRENGTH) 1200 MG TB12 Take 1 tablet (1,200 mg total) by mouth every 12 (twelve) hours as needed. 10/18/14   Joretta Bachelor, PA  HYDROcodone-acetaminophen (NORCO/VICODIN) 5-325 MG per tablet Take 1-2 tablets by mouth every 4 (four) hours as needed. 10/27/14   Carman Ching, PA-C  ibuprofen (ADVIL,MOTRIN) 200 MG tablet Take 200 mg by mouth every 6 (six) hours as needed for headache.    Historical Provider, MD  ipratropium (ATROVENT) 0.03 % nasal spray Place 2 sprays into both nostrils 2 (two) times daily. 10/18/14   Dorian Heckle English, PA  lisinopril (PRINIVIL,ZESTRIL) 20 MG tablet Take 20 mg by mouth daily.    Historical Provider, MD  Multiple Vitamin (MULTIVITAMIN WITH MINERALS) TABS tablet Take 1 tablet by mouth daily.    Historical Provider, MD   Triage Vitals: BP 128/96 mmHg  Pulse 86  Temp(Src) 97.6 F (36.4 C) (Oral)  Resp 16  Ht 5\' 9"   (1.753 m)  Wt 183 lb (83.008 kg)  BMI 27.01 kg/m2  SpO2 100% Physical Exam  Constitutional: He is oriented to person, place, and time. He appears well-developed and well-nourished. No distress.  HENT:  Head: Normocephalic and atraumatic.  Mouth/Throat: Oropharynx is clear and moist.  Eyes: Conjunctivae are normal.  Neck: Normal range of motion. Neck supple. No spinous process tenderness and no muscular tenderness present.  Cardiovascular: Normal rate, regular rhythm and normal heart sounds.   Pulmonary/Chest: Effort normal and breath sounds normal. No respiratory distress.  Musculoskeletal: He exhibits no edema.  TTP around area of L5-S1 and right lumbar paraspinal muscles with spasm. FROM, pain lumbar with flexion.  Neurological: He is alert and oriented to person, place, and time. He has normal strength.  Strength lower extremities 5/5 and equal bilateral. Sensation intact. Normal gait.  Skin: Skin is warm and dry. No rash noted. He is not diaphoretic.  Psychiatric: He has a normal mood and affect. His behavior is normal.  Nursing note and vitals reviewed.   ED Course  Procedures (including critical care time) DIAGNOSTIC STUDIES: Oxygen Saturation is 100% on RA, normal by my interpretation.   COORDINATION OF CARE: 4:51 PM- Will speak with Dr. Sabra Heck about appropriate course of treatment. Pt verbalizes understanding and agrees to plan.  5:00 PM-Spoke with Dr. Sabra Heck and he agrees that no MRI is necessary at this time. Will X-Ray lumbar spine and treat pain.  Medications  diazepam (VALIUM) tablet 5 mg (5 mg Oral Given 10/27/14 1718)  oxyCODONE-acetaminophen (PERCOCET/ROXICET) 5-325 MG per tablet 2 tablet (2 tablets Oral Given 10/27/14 1718)    Labs Review Labs Reviewed - No data to display  Imaging Review Dg Lumbar Spine Complete  10/27/2014   CLINICAL DATA:  Low back pain and right leg pain.  EXAM: LUMBAR SPINE - COMPLETE 4+ VIEW  COMPARISON:  Radiographs dated 10/02/2013  and lumbar MRI dated 10/25/2013  FINDINGS: There is no evidence of lumbar spine fracture. Alignment is normal. Intervertebral disc spaces are maintained.  IMPRESSION: Normal exam.   Electronically Signed   By: Lorriane Shire M.D.   On: 10/27/2014 17:57     EKG Interpretation None      MDM   Final diagnoses:  Midline low back pain without sciatica   NAD. AFVSS. No red flags concerning patient's back pain. No s/s of central cord compression or cauda equina. Lower extremities are neurovascularly intact and patient is ambulating without difficulty. Xray negative. Pain improved with percocet and valium. Stable for d/c. F/u with PCP and specialist in [facility]. Return precautions given. Patient states understanding of treatment care plan and is agreeable.  I personally performed the services described in this documentation, which was scribed in my presence. The recorded information has been reviewed and is accurate.    Carman Ching, PA-C 10/27/14 1807  Noemi Chapel, MD 10/28/14 [phone]

## 2014-10-27 NOTE — Discharge Instructions (Signed)
Take Vicodin for severe pain only. No driving or operating heavy machinery while taking vicodin. This medication may cause drowsiness. No driving or operating heavy machinery while taking flexeril. This medication may make you drowsy. Follow up with your primary care doctor and your back specialist in [place].  Back Pain, Adult Low back pain is very common. About 1 in 5 people have back pain.The cause of low back pain is rarely dangerous. The pain often gets better over time.About half of people with a sudden onset of back pain feel better in just 2 weeks. About 8 in 10 people feel better by 6 weeks.  CAUSES Some common causes of back pain include:  Strain of the muscles or ligaments supporting the spine.  Wear and tear (degeneration) of the spinal discs.  Arthritis.  Direct injury to the back. DIAGNOSIS Most of the time, the direct cause of low back pain is not known.However, back pain can be treated effectively even when the exact cause of the pain is unknown.Answering your caregiver's questions about your overall health and symptoms is one of the most accurate ways to make sure the cause of your pain is not dangerous. If your caregiver needs more information, he or she may order lab work or imaging tests (X-rays or MRIs).However, even if imaging tests show changes in your back, this usually does not require surgery. HOME CARE INSTRUCTIONS For many people, back pain returns.Since low back pain is rarely dangerous, it is often a condition that people can learn to [facility] their own.   Remain active. It is stressful on the back to sit or stand in one place. Do not sit, drive, or stand in one place for more than 30 minutes at a time. Take short walks on level surfaces as soon as pain allows.Try to increase the length of time you walk each day.  Do not stay in bed.Resting more than 1 or 2 days can delay your recovery.  Do not avoid exercise or work.Your body is made to move.It  is not dangerous to be active, even though your back may hurt.Your back will likely heal faster if you return to being active before your pain is gone.  Pay attention to your body when you bend and lift. Many people have less discomfortwhen lifting if they bend their knees, keep the load close to their bodies,and avoid twisting. Often, the most comfortable positions are those that put less stress on your recovering back.  Find a comfortable position to sleep. Use a firm mattress and lie on your side with your knees slightly bent. If you lie on your back, put a pillow under your knees.  Only take over-the-counter or prescription medicines as directed by your caregiver. Over-the-counter medicines to reduce pain and inflammation are often the most helpful.Your caregiver may prescribe muscle relaxant drugs.These medicines help dull your pain so you can more quickly return to your normal activities and healthy exercise.  Put ice on the injured area.  Put ice in a plastic bag.  Place a towel between your skin and the bag.  Leave the ice on for 15-20 minutes, 03-04 times a day for the first 2 to 3 days. After that, ice and heat may be alternated to reduce pain and spasms.  Ask your caregiver about trying back exercises and gentle massage. This may be of some benefit.  Avoid feeling anxious or stressed.Stress increases muscle tension and can worsen back pain.It is important to recognize when you are anxious or stressed  and learn ways to manage it.Exercise is a great option. SEEK MEDICAL CARE IF:  You have pain that is not relieved with rest or medicine.  You have pain that does not improve in 1 week.  You have new symptoms.  You are generally not feeling well. SEEK IMMEDIATE MEDICAL CARE IF:   You have pain that radiates from your back into your legs.  You develop new bowel or bladder control problems.  You have unusual weakness or numbness in your arms or legs.  You develop  nausea or vomiting.  You develop abdominal pain.  You feel faint. Document Released: 07/26/2005 Document Revised: 01/25/2012 Document Reviewed: 11/27/2013 [facility] Patient Information 2015 [place], [place]. This information is not intended to replace advice given to you by your health care provider. Make sure you discuss any questions you have with your health care provider.

## 2014-10-27 NOTE — ED Notes (Signed)
Pt c/o pain in lower back x's 3 days.  Pt has hx of bulging disc.

## 2014-12-11 ENCOUNTER — Other Ambulatory Visit: Payer: Self-pay | Admitting: Orthopedic Surgery

## 2014-12-11 DIAGNOSIS — G8929 Other chronic pain: Secondary | ICD-10-CM

## 2014-12-11 DIAGNOSIS — M533 Sacrococcygeal disorders, not elsewhere classified: Principal | ICD-10-CM

## 2015-08-21 MED FILL — OMEPRAZOLE DR 40 MG CAPSULE: 40 | 90 days supply | Qty: 90 | Fill #0

## 2015-09-02 MED FILL — LISINOPRIL 20 MG TABLET: 20 | 90 days supply | Qty: 90 | Fill #2

## 2015-09-24 MED FILL — clonazePAM 0.5 MG TABS: 0.5 | 90 days supply | Qty: 90 | Fill #1

## 2015-12-01 MED FILL — LISINOPRIL 20 MG TABLET: 20 | 90 days supply | Qty: 90 | Fill #3

## 2015-12-25 MED FILL — clonazePAM 0.5 MG TABS: 0.5 | 90 days supply | Qty: 90 | Fill #0 | Status: TO

## 2016-03-01 MED FILL — LISINOPRIL 20 MG TABLET: 20 | 30 days supply | Qty: 30 | Fill #0

## 2016-03-19 DIAGNOSIS — Z0000 Encounter for general adult medical examination without abnormal findings: Secondary | ICD-10-CM | POA: Diagnosis not present

## 2016-03-29 MED FILL — LISINOPRIL 20 MG TABLET: 20 | 30 days supply | Qty: 30 | Fill #0

## 2016-03-29 MED FILL — clonazePAM 0.5 MG TABS: 0.5 | 90 days supply | Qty: 90 | Fill #0

## 2016-04-30 DIAGNOSIS — Z1389 Encounter for screening for other disorder: Secondary | ICD-10-CM | POA: Diagnosis not present

## 2016-04-30 DIAGNOSIS — Z0000 Encounter for general adult medical examination without abnormal findings: Secondary | ICD-10-CM | POA: Diagnosis not present

## 2016-05-03 MED FILL — OMEPRAZOLE DR 40 MG CAPSULE: 40 | 90 days supply | Qty: 90 | Fill #0

## 2016-05-03 MED FILL — LISINOPRIL 20 MG TABLET: 20 | 90 days supply | Qty: 90 | Fill #0

## 2016-06-02 ENCOUNTER — Ambulatory Visit (INDEPENDENT_AMBULATORY_CARE_PROVIDER_SITE_OTHER): Payer: 59 | Admitting: Family Medicine

## 2016-06-02 ENCOUNTER — Encounter: Payer: Self-pay | Admitting: Family Medicine

## 2016-06-02 DIAGNOSIS — M545 Low back pain, unspecified: Secondary | ICD-10-CM

## 2016-06-02 MED ORDER — PREDNISONE 10 MG PO TABS: ORAL_TABLET | ORAL | 0 refills | Status: DC

## 2016-06-02 MED ORDER — METHOCARBAMOL 500 MG PO TABS
500.0000 mg | ORAL_TABLET | Freq: Four times a day (QID) | ORAL | 1 refills | Status: DC | PRN
Start: 2016-06-02 — End: 2018-10-19

## 2016-06-02 MED FILL — METHOCARBAMOL 500 MG TABLET: 500 | 15 days supply | Qty: 60 | Fill #0

## 2016-06-02 MED FILL — predniSONE 10 MG TABS: 10 | 6 days supply | Qty: 21 | Fill #0

## 2016-06-06 DIAGNOSIS — M79604 Pain in right leg: Secondary | ICD-10-CM | POA: Insufficient documentation

## 2016-06-06 DIAGNOSIS — M545 Low back pain, unspecified: Secondary | ICD-10-CM | POA: Insufficient documentation

## 2016-06-06 NOTE — Progress Notes (Signed)
PCP: ROBBINS,ROBERT A, MD  Subjective:   HPI: Patient is a 50 y.o. male here for low back pain.  Patient denies known trauma or injury. He has problems with low back pain radiating into right leg. Has seen Dr. Sherwood Gambler for this before. Current issue started on 10/19 and has worsened. Pain is sharp, 9/10 level. Associated numbness into right leg below the knee. History of ESIs. No skin changes.  Past Medical History:  Diagnosis Date  . Anxiety   . Cluster headaches   . Hypertension   . Tremor     Current Outpatient Prescriptions on File Prior to Visit  Medication Sig Dispense Refill  . clonazePAM (KLONOPIN) 0.5 MG tablet Take 0.5 mg by mouth 2 (two) times daily as needed for anxiety.    Marland Kitchen lisinopril (PRINIVIL,ZESTRIL) 20 MG tablet Take 20 mg by mouth daily.    . Multiple Vitamin (MULTIVITAMIN WITH MINERALS) TABS tablet Take 1 tablet by mouth daily.     No current facility-administered medications on file prior to visit.     Past Surgical History:  Procedure Laterality Date  . VASECTOMY      No Known Allergies  Social History   Social History  . Marital status: Married    Spouse name: Olivia Mackie  . Number of children: 1  . Years of education: College   Occupational History  .  [employer]   Social History Main Topics  . Smoking status: Never Smoker  . Smokeless tobacco: Never Used  . Alcohol use No     Comment: Hx EtOH abuse - quit drinking 7 years ago  . Drug use: No  . Sexual activity: Yes    Birth control/ protection: Condom   Other Topics Concern  . Not on file   Social History Narrative   Pt lives at home family.   Caffeine Use: 3 cups daily.    Family History  Problem Relation Age of Onset  . Diabetes Maternal Grandmother   . Hypertension Mother     BP 134/88   Pulse 87   Ht 5\' 9"  (1.753 m)   Wt 187 lb (84.8 kg)   BMI 27.62 kg/m   Review of Systems: See HPI above.    Objective:  Physical Exam:  Gen: NAD, comfortable in exam  room  Back: No gross deformity, scoliosis. TTP right lumbar paraspinal region.  No midline or bony TTP. Pain on extension > flexion. Strength LEs 5/5 all muscle groups.   1+ right patellar reflex, 2+ left patellar and bilateral achilles. Positive right SLR and crossover SLR Sensation intact to light touch bilaterally. Negative logroll bilateral hips    Assessment & Plan:  1. Low back pain with radiation into right leg - 2/2 lumbar radiculopathy.  Start prednisone with robaxin for spasms.  He has a phone call placed to Dr. Sherwood Gambler as well.  Let us know how he's doing over the next week.

## 2016-06-06 NOTE — Assessment & Plan Note (Signed)
2/2 lumbar radiculopathy.  Start prednisone with robaxin for spasms.  He has a phone call placed to Dr. Sherwood Gambler as well.  Let us know how he's doing over the next week.

## 2016-06-18 DIAGNOSIS — Z6827 Body mass index (BMI) 27.0-27.9, adult: Secondary | ICD-10-CM | POA: Diagnosis not present

## 2016-06-18 DIAGNOSIS — I10 Essential (primary) hypertension: Secondary | ICD-10-CM | POA: Diagnosis not present

## 2016-06-18 DIAGNOSIS — M47816 Spondylosis without myelopathy or radiculopathy, lumbar region: Secondary | ICD-10-CM | POA: Diagnosis not present

## 2016-06-18 DIAGNOSIS — M5136 Other intervertebral disc degeneration, lumbar region: Secondary | ICD-10-CM | POA: Diagnosis not present

## 2016-06-18 DIAGNOSIS — M5440 Lumbago with sciatica, unspecified side: Secondary | ICD-10-CM | POA: Diagnosis not present

## 2016-06-18 DIAGNOSIS — M549 Dorsalgia, unspecified: Secondary | ICD-10-CM | POA: Diagnosis not present

## 2016-06-18 DIAGNOSIS — M5416 Radiculopathy, lumbar region: Secondary | ICD-10-CM | POA: Diagnosis not present

## 2016-06-18 DIAGNOSIS — M546 Pain in thoracic spine: Secondary | ICD-10-CM | POA: Diagnosis not present

## 2016-06-28 MED FILL — clonazePAM 0.5 MG TABS: 0.5 | 90 days supply | Qty: 90 | Fill #0

## 2016-07-08 DIAGNOSIS — H40013 Open angle with borderline findings, low risk, bilateral: Secondary | ICD-10-CM | POA: Diagnosis not present

## 2016-07-08 DIAGNOSIS — H52222 Regular astigmatism, left eye: Secondary | ICD-10-CM | POA: Diagnosis not present

## 2016-07-08 DIAGNOSIS — H5213 Myopia, bilateral: Secondary | ICD-10-CM | POA: Diagnosis not present

## 2016-07-08 DIAGNOSIS — H524 Presbyopia: Secondary | ICD-10-CM | POA: Diagnosis not present

## 2016-07-30 MED FILL — LISINOPRIL 20 MG TABLET: 20 | 90 days supply | Qty: 90 | Fill #1

## 2016-08-04 MED FILL — METHOCARBAMOL 500 MG TABLET: 500 | 15 days supply | Qty: 60 | Fill #1

## 2016-08-04 MED FILL — OMEPRAZOLE DR 40 MG CAPSULE: 40 | 90 days supply | Qty: 90 | Fill #1

## 2016-08-22 IMAGING — DX DG ANKLE COMPLETE 3+V*L*
3 series · 3 of 3 positions shown · non-contrast
Comparison: None.

CLINICAL DATA: Slipped on leads in fell today.  Left ankle pain.

EXAM:
LEFT ANKLE COMPLETE - 3+ VIEW

[ankle ap]
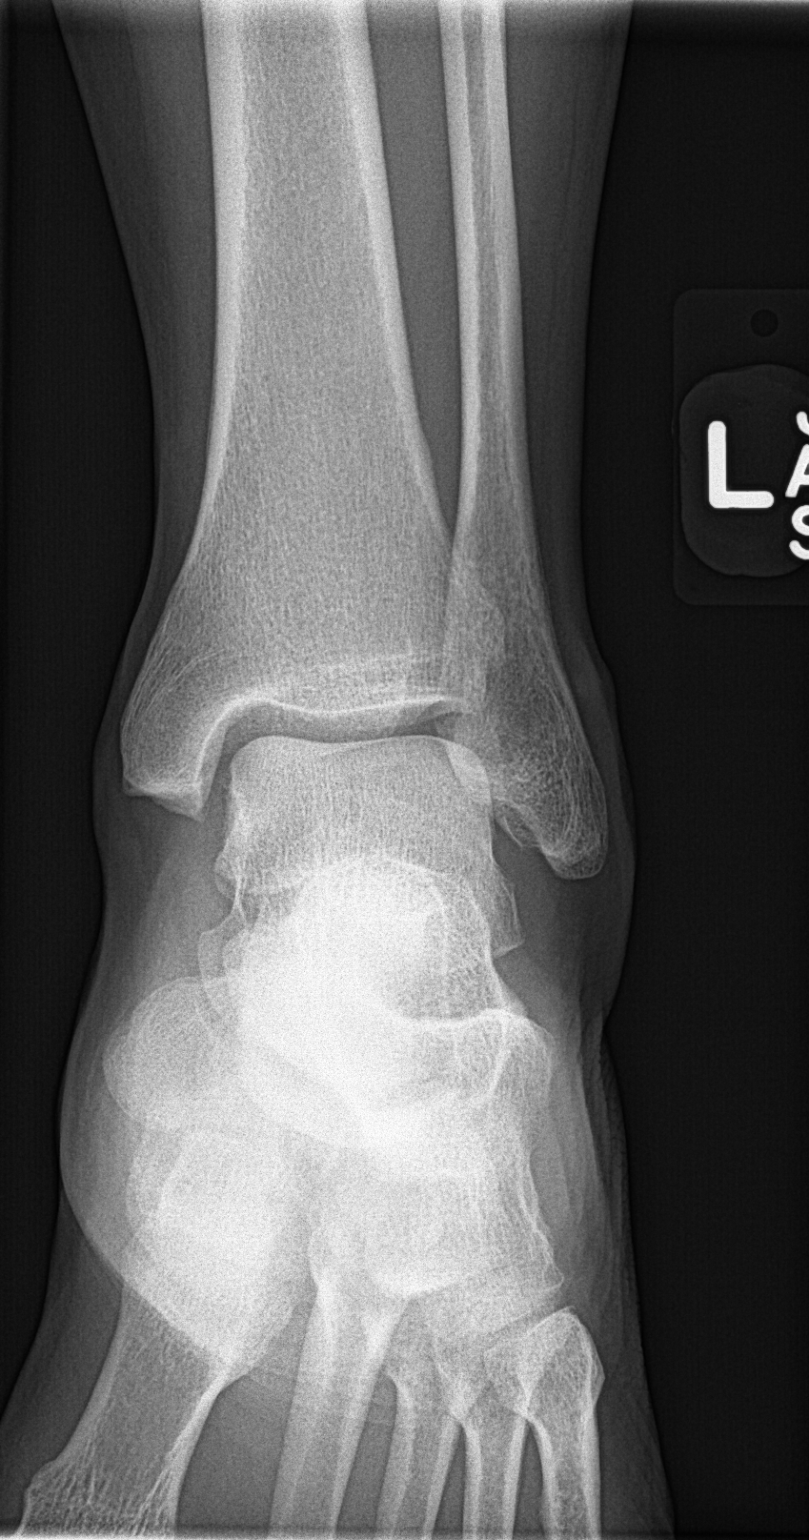

[ankle obl]
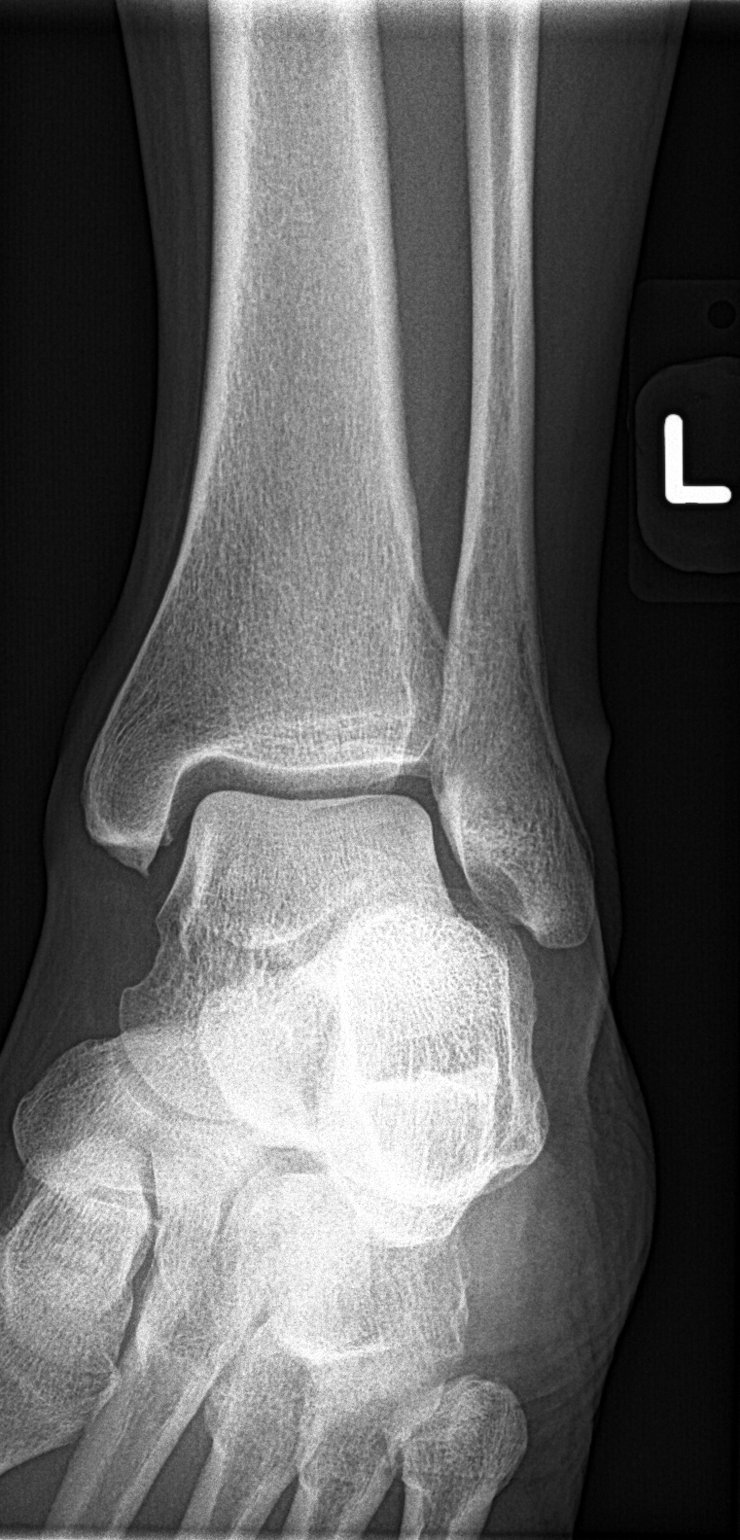

[ankle lat]
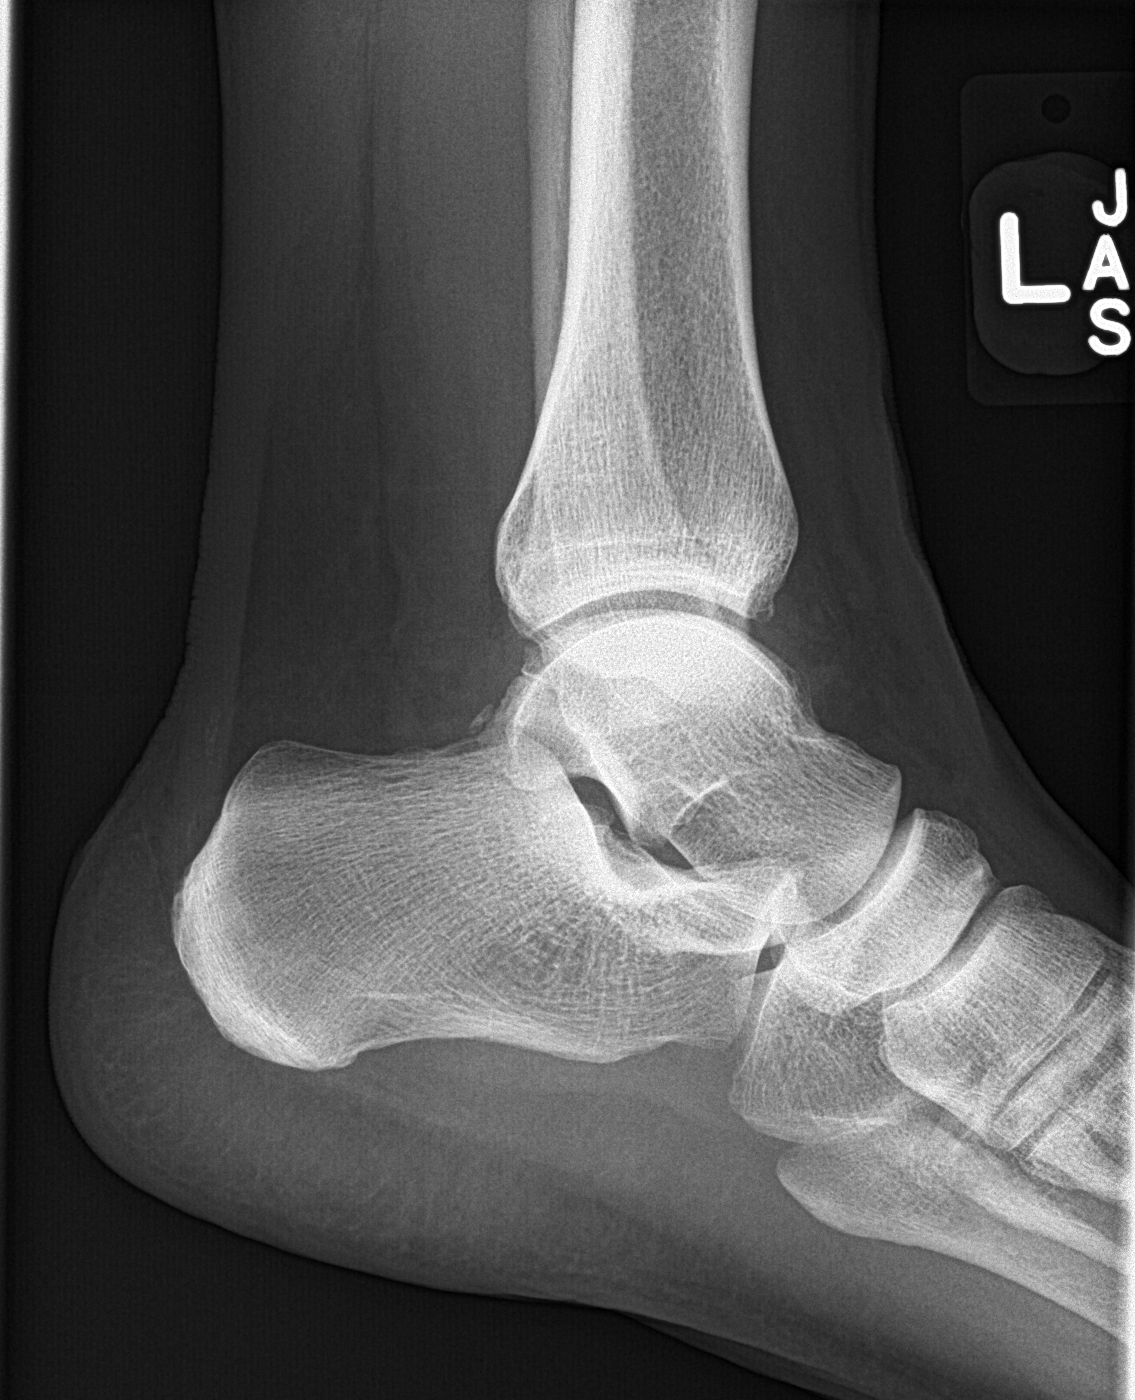

[3 of 3 positions shown; findings below may reference images not displayed]

FINDINGS: There is no evidence of fracture, dislocation, or joint effusion.
There is no evidence of arthropathy or other focal bone abnormality.
Soft tissues are unremarkable.
IMPRESSION: No acute osseous injury of the left ankle.

## 2016-09-23 MED FILL — clonazePAM 0.5 MG TABS: 0.5 | 90 days supply | Qty: 90 | Fill #1

## 2016-10-01 DIAGNOSIS — K047 Periapical abscess without sinus: Secondary | ICD-10-CM | POA: Diagnosis not present

## 2016-10-01 DIAGNOSIS — T783XXA Angioneurotic edema, initial encounter: Secondary | ICD-10-CM | POA: Diagnosis not present

## 2016-10-01 MED FILL — AMLODIPINE BESYLATE 5 MG TA: 5 | 30 days supply | Qty: 30 | Fill #0

## 2016-10-01 MED FILL — AMOXICILLIN 875 MG TABLET: 875 | 10 days supply | Qty: 30 | Fill #0

## 2016-10-29 MED FILL — LISINOPRIL 20 MG TABLET: 20 | 90 days supply | Qty: 90 | Fill #2

## 2016-11-19 MED FILL — AMLODIPINE BESYLATE 5 MG TA: 5 | 30 days supply | Qty: 30 | Fill #1

## 2016-11-19 MED FILL — OMEPRAZOLE DR 40 MG CAPSULE: 40 | 90 days supply | Qty: 90 | Fill #2

## 2016-11-25 MED FILL — traMADol HCL 50 MG TABS: 50 | 3 days supply | Qty: 10 | Fill #0

## 2016-11-25 MED FILL — AMOXICILLIN 500 MG CAPSULE: 500 | 6 days supply | Qty: 25 | Fill #0

## 2016-12-08 MED FILL — IBUPROFEN 800 MG TABLET: 800 | 4 days supply | Qty: 16 | Fill #0

## 2016-12-27 IMAGING — DX DG LUMBAR SPINE COMPLETE 4+V
5 series · 5 of 5 positions shown · non-contrast
Comparison: Radiographs dated 10/02/2013 and lumbar MRI dated
10/25/2013

CLINICAL DATA: Low back pain and right leg pain.

EXAM:
LUMBAR SPINE - COMPLETE 4+ VIEW

[l-spine ap]
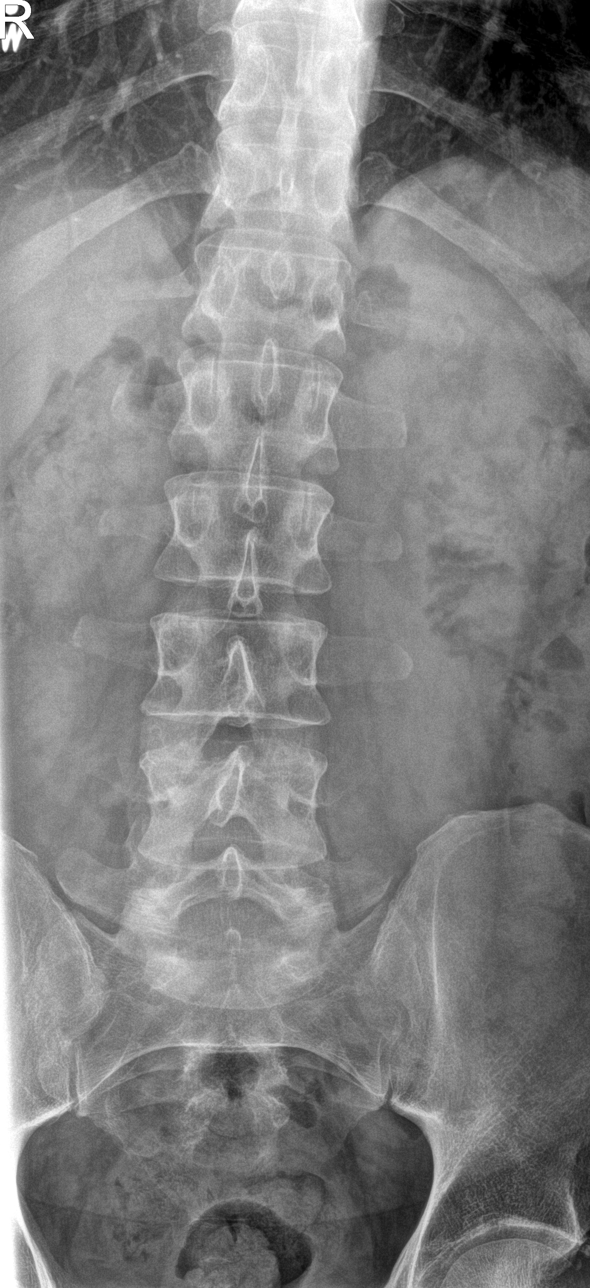

[l-spine obl (1 of 2)]
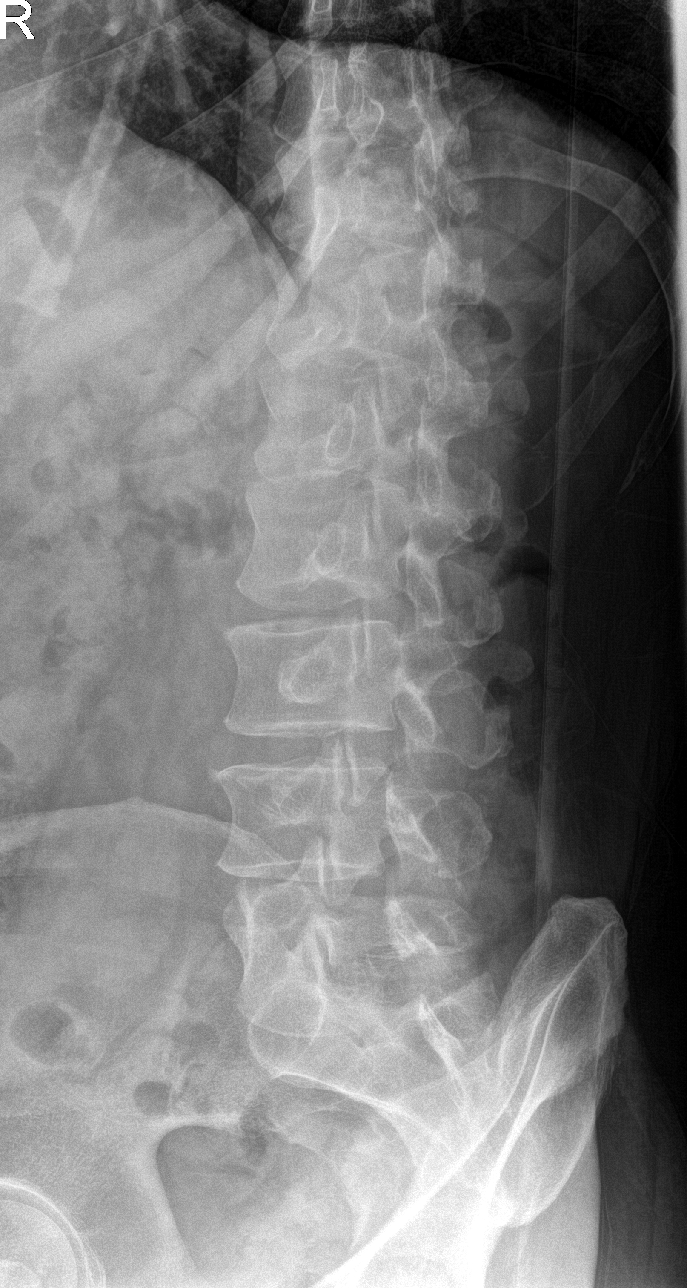

[l-spine obl (2 of 2)]
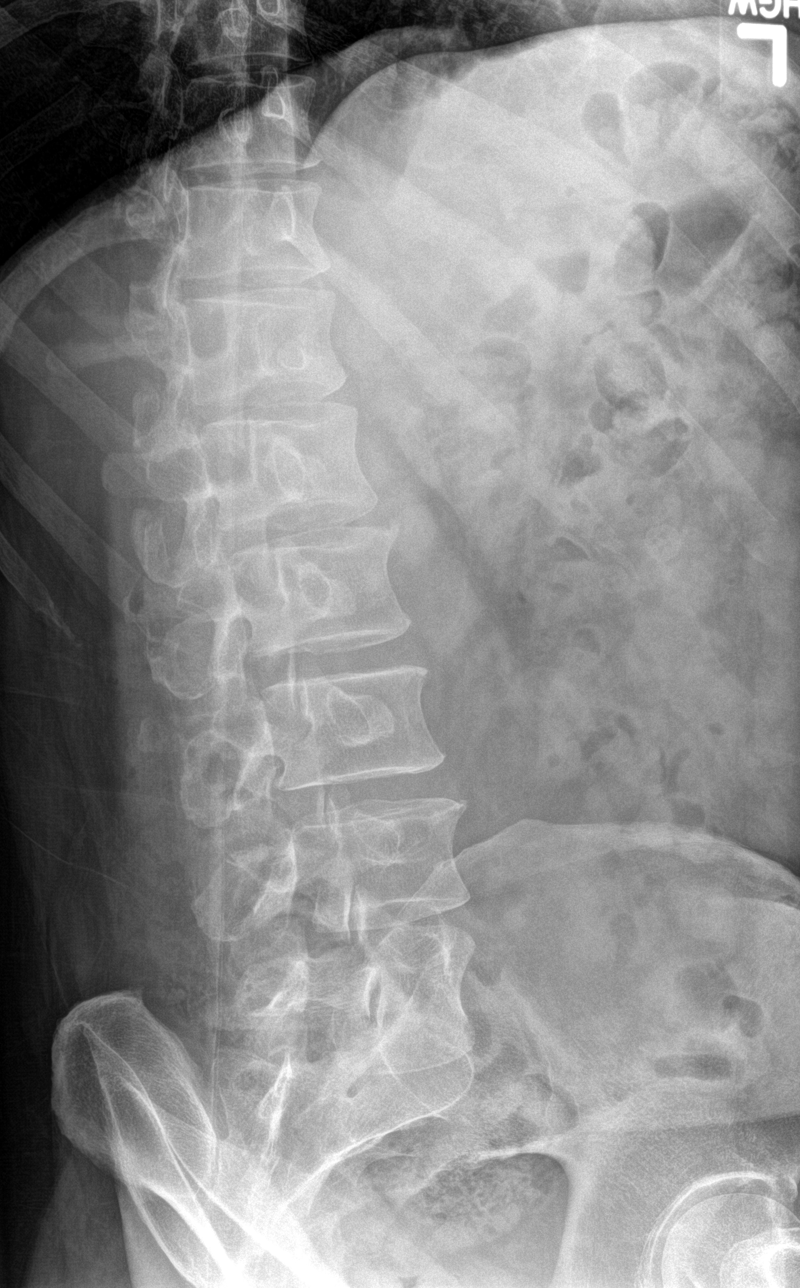

[l-spine lat]
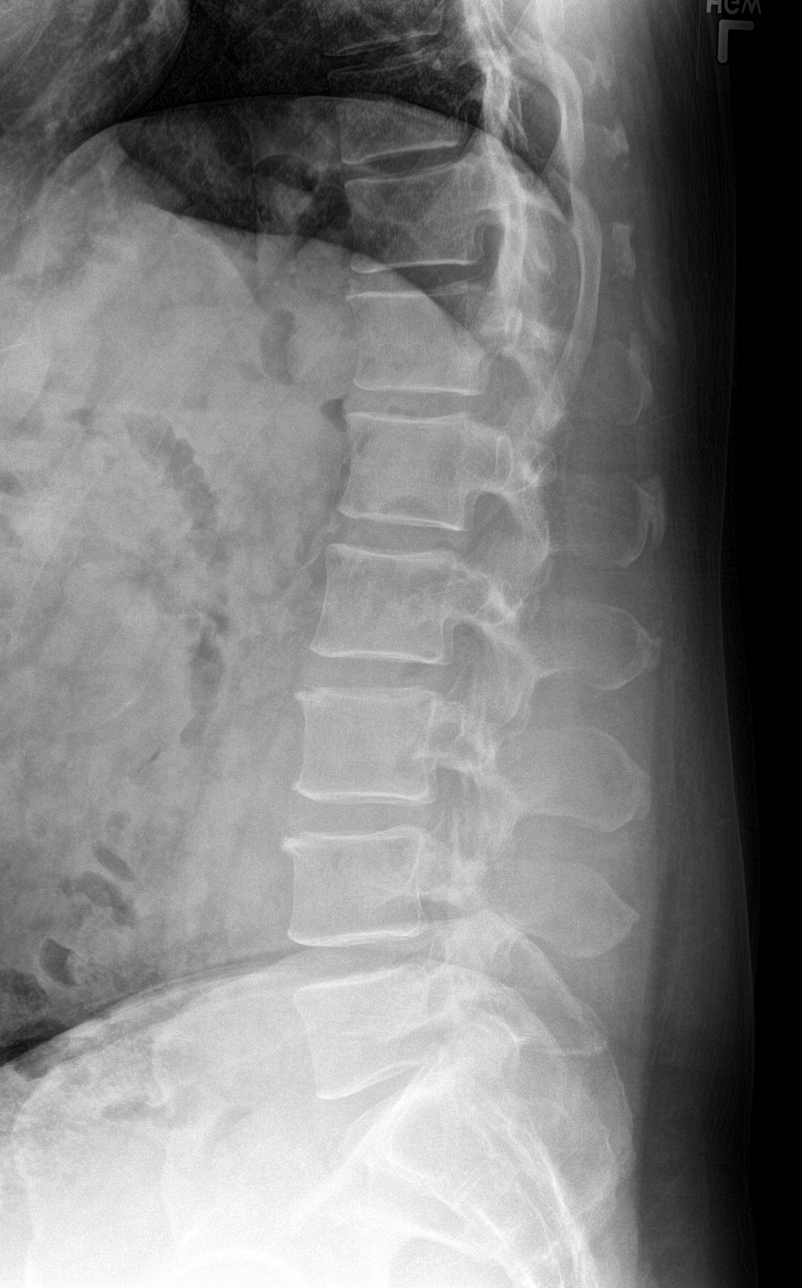

[l-spine spot]
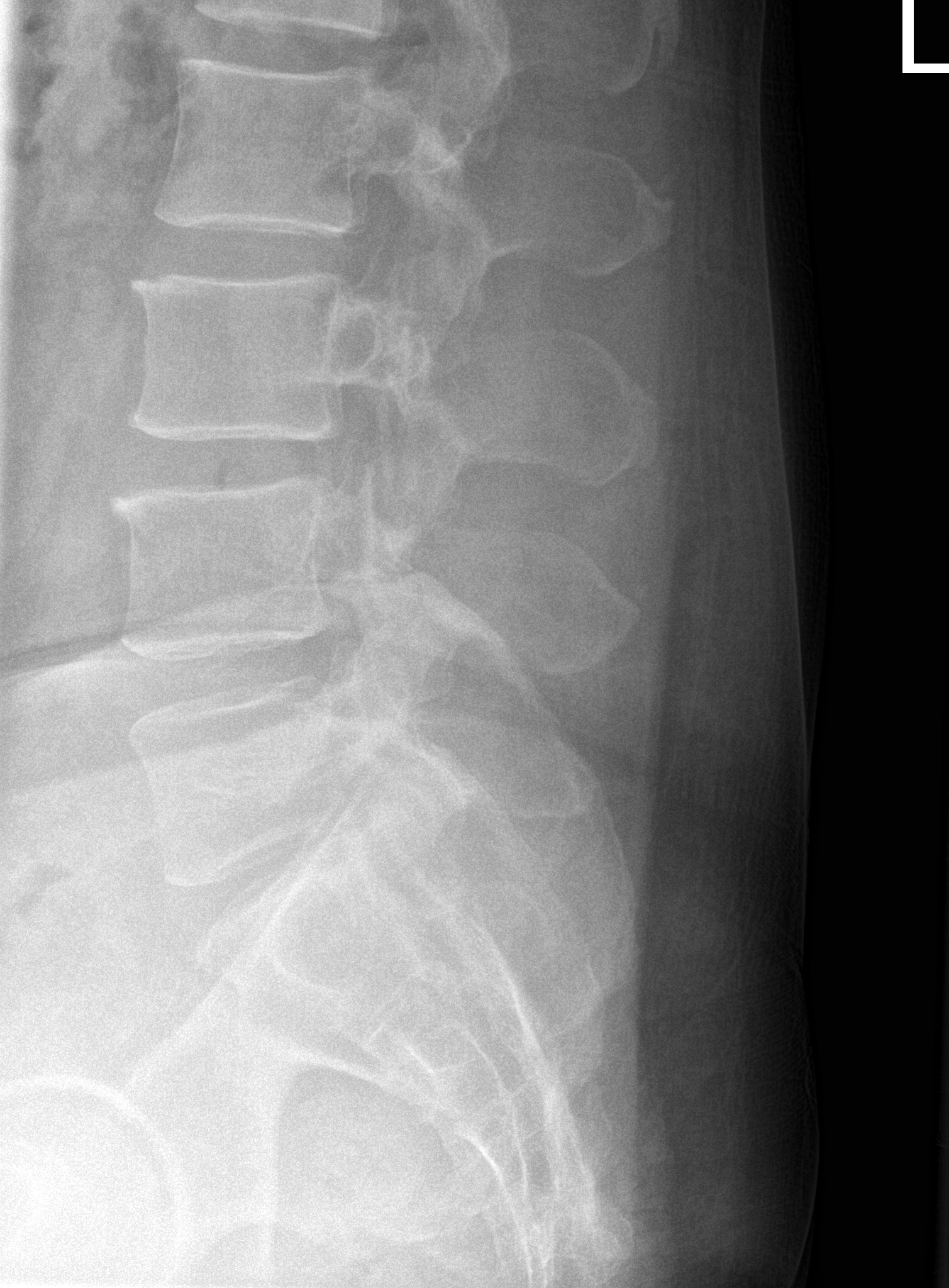

[5 of 5 positions shown; findings below may reference images not displayed]

FINDINGS: There is no evidence of lumbar spine fracture. Alignment is normal.
Intervertebral disc spaces are maintained.
IMPRESSION: Normal exam.

## 2017-01-06 DIAGNOSIS — I10 Essential (primary) hypertension: Secondary | ICD-10-CM | POA: Diagnosis not present

## 2017-01-06 DIAGNOSIS — F419 Anxiety disorder, unspecified: Secondary | ICD-10-CM | POA: Insufficient documentation

## 2017-01-06 DIAGNOSIS — Z760 Encounter for issue of repeat prescription: Secondary | ICD-10-CM | POA: Diagnosis not present

## 2017-01-06 MED FILL — clonazePAM 0.5 MG TABS: 0.5 | 60 days supply | Qty: 60 | Fill #0

## 2017-01-26 MED FILL — LISINOPRIL 20 MG TABLET: 20 | 90 days supply | Qty: 90 | Fill #0

## 2017-03-07 MED FILL — clonazePAM 0.5 MG TABS: 0.5 | 60 days supply | Qty: 60 | Fill #1

## 2017-04-25 MED FILL — LISINOPRIL 20 MG TABS: 20 | 90 days supply | Qty: 90 | Fill #1

## 2017-05-06 DIAGNOSIS — I10 Essential (primary) hypertension: Secondary | ICD-10-CM | POA: Diagnosis not present

## 2017-05-06 DIAGNOSIS — F419 Anxiety disorder, unspecified: Secondary | ICD-10-CM | POA: Diagnosis not present

## 2017-05-06 DIAGNOSIS — Z1211 Encounter for screening for malignant neoplasm of colon: Secondary | ICD-10-CM | POA: Diagnosis not present

## 2017-05-06 DIAGNOSIS — Z0000 Encounter for general adult medical examination without abnormal findings: Secondary | ICD-10-CM | POA: Diagnosis not present

## 2017-05-06 DIAGNOSIS — K219 Gastro-esophageal reflux disease without esophagitis: Secondary | ICD-10-CM | POA: Diagnosis not present

## 2017-05-06 MED FILL — ESOMEPRAZOLE MAGNESIUM 40 M: 40 | 90 days supply | Qty: 90 | Fill #0

## 2017-05-06 MED FILL — clonazePAM 0.5 MG TABS: 0.5 | 90 days supply | Qty: 90 | Fill #0

## 2017-07-11 MED FILL — PEG-3350 SOLUTION: 420 | 1 days supply | Qty: 4000 | Fill #0

## 2017-07-14 DIAGNOSIS — H40013 Open angle with borderline findings, low risk, bilateral: Secondary | ICD-10-CM | POA: Diagnosis not present

## 2017-07-15 DIAGNOSIS — D123 Benign neoplasm of transverse colon: Secondary | ICD-10-CM | POA: Diagnosis not present

## 2017-07-15 DIAGNOSIS — K5730 Diverticulosis of large intestine without perforation or abscess without bleeding: Secondary | ICD-10-CM | POA: Diagnosis not present

## 2017-07-15 DIAGNOSIS — Z1211 Encounter for screening for malignant neoplasm of colon: Secondary | ICD-10-CM | POA: Diagnosis not present

## 2017-07-15 DIAGNOSIS — K589 Irritable bowel syndrome without diarrhea: Secondary | ICD-10-CM | POA: Diagnosis not present

## 2017-07-25 MED FILL — LISINOPRIL 20 MG TABLET: 20 | 90 days supply | Qty: 90 | Fill #2

## 2017-08-01 MED FILL — clonazePAM 0.5 MG TABS: 0.5 | 90 days supply | Qty: 90 | Fill #1

## 2017-08-26 DIAGNOSIS — J101 Influenza due to other identified influenza virus with other respiratory manifestations: Secondary | ICD-10-CM | POA: Diagnosis not present

## 2017-08-26 MED FILL — OSELTAMIVIR PHOSPHATE 75 MG: 75 | 5 days supply | Qty: 10 | Fill #0

## 2017-09-01 MED FILL — FLUTICASONE PROP 50 MCG SPR: 50 | 30 days supply | Qty: 16 | Fill #0

## 2017-09-23 DIAGNOSIS — I10 Essential (primary) hypertension: Secondary | ICD-10-CM | POA: Diagnosis not present

## 2017-09-23 DIAGNOSIS — J329 Chronic sinusitis, unspecified: Secondary | ICD-10-CM | POA: Diagnosis not present

## 2017-09-23 DIAGNOSIS — J40 Bronchitis, not specified as acute or chronic: Secondary | ICD-10-CM | POA: Diagnosis not present

## 2017-09-23 MED FILL — predniSONE 20 MG TABS: 20 | 5 days supply | Qty: 10 | Fill #0

## 2017-09-23 MED FILL — BENZONATATE 200 MG CAP: 200 | 7 days supply | Qty: 20 | Fill #0

## 2017-09-23 MED FILL — AMLODIPINE BESYLATE 5 MG TA: 5 | 30 days supply | Qty: 30 | Fill #0

## 2017-10-17 MED FILL — ESOMEPRAZOLE MAG DR 40 MG C: 40 | 90 days supply | Qty: 90 | Fill #1

## 2017-11-14 MED FILL — clonazePAM 0.5 MG TABS: 0.5 | 90 days supply | Qty: 90 | Fill #0 | Status: TO

## 2017-11-15 MED FILL — AMLODIPINE BESYLATE 10 MG T: 10 | 90 days supply | Qty: 90 | Fill #0 | Status: TO

## 2017-12-28 MED FILL — ESOMEPRAZOLE MAG DR 40 MG C: 40 | 90 days supply | Qty: 90 | Fill #2 | Status: TO

## 2018-02-04 DIAGNOSIS — S39012A Strain of muscle, fascia and tendon of lower back, initial encounter: Secondary | ICD-10-CM | POA: Diagnosis not present

## 2018-02-04 DIAGNOSIS — M545 Low back pain: Secondary | ICD-10-CM | POA: Diagnosis not present

## 2018-02-13 MED FILL — clonazePAM 0.5 MG TABS: 0.5 | 90 days supply | Qty: 90 | Fill #0

## 2018-02-13 MED FILL — AMLODIPINE BESYLATE 10 MG T: 10 | 90 days supply | Qty: 90 | Fill #0

## 2018-03-20 DIAGNOSIS — M21242 Flexion deformity, left finger joints: Secondary | ICD-10-CM | POA: Insufficient documentation

## 2018-03-21 DIAGNOSIS — M21242 Flexion deformity, left finger joints: Secondary | ICD-10-CM | POA: Diagnosis not present

## 2018-04-03 MED FILL — ESOMEPRAZOLE MAG DR 40 MG C: 40 | 90 days supply | Qty: 90 | Fill #0

## 2018-04-20 DIAGNOSIS — M20012 Mallet finger of left finger(s): Secondary | ICD-10-CM | POA: Diagnosis not present

## 2018-05-10 MED FILL — clonazePAM 0.5 MG TABS: 0.5 | 90 days supply | Qty: 90 | Fill #1

## 2018-05-10 MED FILL — AMLODIPINE BESYLATE 10 MG T: 10 | 90 days supply | Qty: 90 | Fill #1

## 2018-06-02 DIAGNOSIS — M20012 Mallet finger of left finger(s): Secondary | ICD-10-CM | POA: Diagnosis not present

## 2018-06-02 MED FILL — oxyCODONE HCL 5 MG TABS: 5 | 2 days supply | Qty: 10 | Fill #0

## 2018-06-05 MED FILL — ONDANSETRON HCL 4 MG TABLET: 4 | 10 days supply | Qty: 30 | Fill #0

## 2018-06-08 DIAGNOSIS — M20012 Mallet finger of left finger(s): Secondary | ICD-10-CM | POA: Diagnosis not present

## 2018-06-09 DIAGNOSIS — Z0000 Encounter for general adult medical examination without abnormal findings: Secondary | ICD-10-CM | POA: Diagnosis not present

## 2018-06-09 MED FILL — FLUTICASONE PROP 50 MCG SPR: 50 | 30 days supply | Qty: 16 | Fill #0

## 2018-06-15 DIAGNOSIS — M20012 Mallet finger of left finger(s): Secondary | ICD-10-CM | POA: Diagnosis not present

## 2018-07-20 DIAGNOSIS — H52223 Regular astigmatism, bilateral: Secondary | ICD-10-CM | POA: Diagnosis not present

## 2018-07-20 DIAGNOSIS — H40013 Open angle with borderline findings, low risk, bilateral: Secondary | ICD-10-CM | POA: Diagnosis not present

## 2018-07-20 DIAGNOSIS — H3589 Other specified retinal disorders: Secondary | ICD-10-CM | POA: Diagnosis not present

## 2018-07-20 DIAGNOSIS — H524 Presbyopia: Secondary | ICD-10-CM | POA: Diagnosis not present

## 2018-07-20 DIAGNOSIS — H53001 Unspecified amblyopia, right eye: Secondary | ICD-10-CM | POA: Diagnosis not present

## 2018-07-20 DIAGNOSIS — H5213 Myopia, bilateral: Secondary | ICD-10-CM | POA: Diagnosis not present

## 2018-07-20 MED FILL — ESOMEPRAZOLE MAG DR 40 MG C: 40 | 90 days supply | Qty: 90 | Fill #0

## 2018-07-27 DIAGNOSIS — M20012 Mallet finger of left finger(s): Secondary | ICD-10-CM | POA: Diagnosis not present

## 2018-08-11 MED FILL — AMLODIPINE BESYLATE 10 MG T: 10 | 90 days supply | Qty: 90 | Fill #0

## 2018-08-11 MED FILL — clonazePAM 0.5 MG TABS: 0.5 | 90 days supply | Qty: 90 | Fill #0

## 2018-08-17 DIAGNOSIS — M20012 Mallet finger of left finger(s): Secondary | ICD-10-CM | POA: Diagnosis not present

## 2018-08-24 DIAGNOSIS — M20012 Mallet finger of left finger(s): Secondary | ICD-10-CM | POA: Diagnosis not present

## 2018-09-07 DIAGNOSIS — M20012 Mallet finger of left finger(s): Secondary | ICD-10-CM | POA: Diagnosis not present

## 2018-09-21 DIAGNOSIS — R002 Palpitations: Secondary | ICD-10-CM | POA: Diagnosis not present

## 2018-09-21 DIAGNOSIS — R9431 Abnormal electrocardiogram [ECG] [EKG]: Secondary | ICD-10-CM | POA: Diagnosis not present

## 2018-10-17 ENCOUNTER — Ambulatory Visit: Payer: 59 | Admitting: Cardiology

## 2018-10-18 ENCOUNTER — Other Ambulatory Visit: Payer: Self-pay

## 2018-10-18 NOTE — Progress Notes (Signed)
Cardiology Office Note:    Date:  10/19/2018   ID:  Joel Roberts, DOB [birth year], MRN [id number]  PCP:  Joel Broker, MD  Cardiologist:  Joel More, MD   Referring MD: Joel Broker, MD  ASSESSMENT:    1. Palpitations   2. Pure hypercholesterolemia    PLAN:    In order of problems listed above:  1. Symptoms clearly related to precipitated by and alleviated with stopping his double espressos in the morning.  We talked about doing an extended Holter monitor and really do not feel the need I gave him information to purchase an iPhone adapter if he had questions or contact me and arrange for an ambulatory heart rhythm monitor if the symptoms recur and are bothersome. 2. He has significant dyslipidemia is at increased risk with age hypertension would benefit from and start a low intensity statin pravastatin 20 mg daily follow-up labs with his PCP 3. 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  Concerns regarding medicines are outlined above.  Orders Placed This Encounter  Procedures  . EKG 12-Lead   Meds ordered this encounter  Medications  . pravastatin (PRAVACHOL) 20 MG tablet    Sig: Take 1 tablet (20 mg total) by mouth every evening.    Dispense:  30 tablet    Refill:  6     Chief Complaint  Patient presents with  . Palpitations    History of Present Illness:    Joel Roberts is a 53 y.o. male who is being seen today for the evaluation of palpitation at the request of Joel Broker, MD.  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  He has no history of congenital rheumatic heart disease no exercise intolerance chest pain shortness of breath edema no palpitation or syncope he has no history of congenital rheumatic heart disease and uses no over-the-counter proarrhythmic drugs.  Recent labs with his PCP show cholesterol 06/09/2018 205 LDL direct 169 HDL cholesterol 162 HDL 43 CMP was normal 09/21/2018 including TSH free T4.  Recently he is noticing his heart beating skipping a beat that tend to occur after having double espresso in the morning he gained insight stopped in the symptoms of for all purposes his symptoms stopped after withdrawing from double espresso in the morning he is a vigorous active man no exercise intolerance chest pain dyspnea or syncope.  He has no history of congenital rheumatic heart disease his thyroid studies are normal EKG is normal and uses no over-the-counter proarrhythmic drugs Past Medical History:  Diagnosis Date  . Anxiety   . Cluster headaches   . Hypertension   . Tremor     Past Surgical History:  Procedure Laterality Date  . FINGER SURGERY    . VASECTOMY    . WISDOM TOOTH EXTRACTION      Current Medications: Current Meds  Medication Sig  . acetaminophen (TYLENOL) 500 MG tablet Take 500 mg by mouth daily  as needed.  Marland Kitchen amLODipine (NORVASC) 10 MG tablet Take 10 mg by mouth daily.  . clonazePAM (KLONOPIN) 0.5 MG tablet Take 0.5 mg by mouth 2 (two) times daily as needed for anxiety.  Marland Kitchen esomeprazole (NEXIUM) 40 MG capsule Take 40 mg by mouth daily.  . Multiple Vitamin (MULTIVITAMIN WITH MINERALS) TABS tablet Take 1 tablet by mouth daily.     Allergies:   Patient has no known allergies.   Social History   Socioeconomic History  . Marital status: Married    Spouse name: Olivia Mackie  . Number of children: 1  . Years of education: College  . Highest education level: Not on  file  Occupational History    Employer: [employer]  . Financial resource strain: Not on file  . Food insecurity:    Worry: Not on file    Inability: Not on file  . Transportation needs:    Medical: Not on file    Non-medical: Not on file  Tobacco Use  . Smoking status: Never Smoker  . Smokeless tobacco: Never Used  Substance and Sexual Activity  . Alcohol use: No    Comment: Hx EtOH abuse - quit drinking 7 years ago  . Drug use: No  . Sexual activity: Yes    Birth control/protection: Condom  Lifestyle  . Physical activity:    Days per week: Not on file    Minutes per session: Not on file  . Stress: Not on file  Relationships  . Social connections:    Talks on phone: Not on file    Gets together: Not on file    Attends religious service: Not on file    Active member of club or organization: Not on file    Attends meetings of clubs or organizations: Not on file    Relationship status: Not on file  Other Topics Concern  . Not on file  Social History Narrative   Pt lives at home family.   Caffeine Use: 3 cups daily.     Family History: The patient's family history includes Diabetes in his maternal grandmother; Heart attack in his maternal aunt, maternal aunt, and maternal grandfather; Hypertension in his mother; Valvular heart disease in his mother.  ROS:   Review of Systems  Constitution: Negative.  HENT: Negative.   Eyes: Negative.   Cardiovascular: Positive for palpitations.  Respiratory: Negative.   Endocrine: Negative.   Hematologic/Lymphatic: Negative.   Skin: Negative.   Musculoskeletal: Negative.   Gastrointestinal: Negative.   Genitourinary: Negative.   Neurological: Negative.   Psychiatric/Behavioral: Negative.   Allergic/Immunologic: Negative.    Please see the history of present illness.     All other systems reviewed and are negative.  EKGs/Labs/Other Studies Reviewed:    The following studies were reviewed today:   EKG:  EKG  is  ordered today.  The ekg ordered today is personally reviewed and demonstrates sinus rhythm normal  Recent Labs: Results for orders placed or performed in visit on 09/21/18  Comprehensive Metabolic Panel  Result Value Ref Range  Sodium 140 135 - 146 MMOL/L  Potassium 4.1 3.5 - 5.3 MMOL/L  Chloride 102 98 - 110 MMOL/L  CO2 31 (H) 23 - 30 MMOL/L  BUN 20 8 - 24 MG/DL  Glucose 97 70 - 99 MG/DL  Creatinine 0.98 0.50 - 1.50 MG/DL  Calcium 9.7 8.5 - 10.5 MG/DL  Total Protein 7.7 6.0 - 8.3 G/DL  Albumin 4.6 3.5 - 5.0 G/DL  Total Bilirubin 0.7  0.1 - 1.2 MG/DL  Alkaline Phosphatase 72 25 - 125 IU/L or U/L  AST (SGOT) 22 5 - 40 IU/L or U/L  ALT (SGPT) 18 5 - 50 IU/L or U/L  Anion Gap 7 4 - 14 MMOL/L  Est. GFR Non-African American 89 >=60 ML/MIN/1.73 M*2  Est. GFR African American >=90 >=60 ML/MIN/1.73 M*2  Thyroid Panel  Result Value Ref Range  TSH 1.634 0.450 - 5.330 UIU/ML  T4 TOTAL 10.0 5.5 - 11.8 mcg/dL  T3 Uptake 41.3 32.0 - 45.0 %  Free Thyroxine Index 10.6 4.8 - 11.3   EKG 09/21/18: Result Narrative  Ventricular Rate          67    BPM          Atrial Rate            67    BPM          P-R Interval            144    ms          QRS Duration            80    ms          Q-T Interval            390    ms          QTC                412    ms          P Axis               46    degrees        R Axis               39    degrees        T Axis               40    degrees        Sinus rhythm Poor R wave progression, consider obesity, pulmonary disease, anterior infarct, or lead placement     Physical Exam:    VS:  BP 112/84 (BP Location: Left Arm, Patient Position: Sitting, Cuff Size: Normal)   Pulse 68   Ht 5' 9.5" (1.765 m)   Wt 179 lb 9.6 oz (81.5 kg)   SpO2  97%   BMI 26.14 kg/m     Wt Readings from Last 3 Encounters:  10/19/18 179 lb 9.6 oz (81.5 kg)  06/02/16 187 lb (84.8 kg)  10/27/14 183 lb (83 kg)     GEN: No xanthomas or colitis well nourished, well developed in no acute distress HEENT: Normal NECK: No JVD; No carotid bruits LYMPHATICS: No lymphadenopathy CARDIAC: RRR, no murmurs, rubs, gallops RESPIRATORY:  Clear to auscultation without rales, wheezing or rhonchi  ABDOMEN: Soft, non-tender, non-distended MUSCULOSKELETAL:  No edema; No deformity  SKIN: Warm and dry NEUROLOGIC:  Alert and oriented x 3 PSYCHIATRIC:  Normal affect     Signed, Joel More, MD  10/19/2018 3:28 PM    [facility]

## 2018-10-19 ENCOUNTER — Ambulatory Visit: Payer: 59 | Admitting: Cardiology

## 2018-10-19 ENCOUNTER — Encounter: Payer: Self-pay | Admitting: Cardiology

## 2018-10-19 ENCOUNTER — Other Ambulatory Visit: Payer: Self-pay

## 2018-10-19 DIAGNOSIS — R002 Palpitations: Secondary | ICD-10-CM

## 2018-10-19 DIAGNOSIS — E7800 Pure hypercholesterolemia, unspecified: Secondary | ICD-10-CM | POA: Diagnosis not present

## 2018-10-19 DIAGNOSIS — E785 Hyperlipidemia, unspecified: Secondary | ICD-10-CM | POA: Insufficient documentation

## 2018-10-19 MED ORDER — PRAVASTATIN SODIUM 20 MG PO TABS: 20.0000 mg | ORAL_TABLET | Freq: Every evening | ORAL | 6 refills | Status: AC

## 2018-10-19 MED FILL — PRAVASTATIN SODIUM 20 MG TA: 20 | 90 days supply | Qty: 90 | Fill #0

## 2018-10-19 NOTE — Patient Instructions (Addendum)
Medication Instructions:  Your physician has recommended you make the following change in your medication:   START pravastatin (pravachol) 20 mg: Take 1 tablet daily with your evening meal  If you need a refill on your cardiac medications before your next appointment, please call your pharmacy.   Lab work: None  If you have labs (blood work) drawn today and your tests are completely normal, you will receive your results only by: Marland Kitchen MyChart Message (if you have MyChart) OR . A paper copy in the mail If you have any lab test that is abnormal or we need to change your treatment, we will call you to review the results.  Testing/Procedures: You had an EKG today.   Follow-Up: At [facility], you and your health needs are our priority.  As part of our continuing mission to provide you with exceptional heart care, we have created designated Provider Care Teams.  These Care Teams include your primary Cardiologist (physician) and Advanced Practice Providers (APPs -  Physician Assistants and Nurse Practitioners) who all work together to provide you with the care you need, when you need it. You will need a follow up appointment as needed if symptoms worsen or fail to improve.       1. Avoid all over-the-counter antihistamines except Claritin/Loratadine and Zyrtec/Cetrizine. 2. Avoid all combination including cold sinus allergies flu decongestant and sleep medications 3. You can use Robitussin DM Mucinex and Mucinex DM for cough. 4. can use Tylenol aspirin ibuprofen and naproxen but no combinations such as sleep or sinus.  [email or web address]        FDA-cleared, clinical grade mobile EKG monitor: Joel Roberts is the most clinically-validated mobile EKG used by the world's leading cardiac care medical professionals With Basic service, know instantly if your heart rhythm is normal or if atrial fibrillation is detected, and email the last single EKG  recording to yourself or your doctor Premium service, available for purchase through the [employer] app for $9.99 per month or $99 per year, includes unlimited history and storage of your EKG recordings, a monthly EKG summary report to share with your doctor, along with the ability to track your blood pressure, activity and weight Includes one [employer]iaMobile phone clip FREE SHIPPING: Standard delivery 1-3 business days. Orders placed by 11:00am PST will ship that afternoon. Otherwise, will ship next business day. All orders ship via [employer] from [place], [place]     Pravastatin tablets What is this medicine? PRAVASTATIN (PRA va stat in) is known as a HMG-CoA reductase inhibitor or 'statin'. It lowers the level of cholesterol and triglycerides in the blood. This drug may also reduce the risk of heart attack, stroke, or other health problems in patients with risk factors for heart disease. Diet and lifestyle changes are often used with this drug. This medicine may be used for other purposes; ask your health care provider or pharmacist if you have questions. COMMON BRAND NAME(S): Pravachol What should I tell my health care provider before I take this medicine? They need to know if you have any of these conditions: -diabetes -if you often drink alcohol -history of stroke -kidney disease -liver disease -muscle aches or weakness -thyroid disease -an unusual or allergic reaction to pravastatin, other medicines, foods, dyes, or preservatives -pregnant or trying to get pregnant -breast-feeding How should I use this medicine? Take pravastatin tablets by mouth. Swallow the tablets with a drink of water. Pravastatin can be taken at anytime of the day, with or without food. Follow the  directions on the prescription label. Take your doses at regular intervals. Do not take your medicine more often than directed. Talk to your pediatrician regarding the use of this medicine in children. Special care may be  needed. Pravastatin has been used in children as young as 87 years of age. Overdosage: If you think you have taken too much of this medicine contact a poison control center or emergency room at once. NOTE: This medicine is only for you. Do not share this medicine with others. What if I miss a dose? If you miss a dose, take it as soon as you can. If it is almost time for your next dose, take only that dose. Do not take double or extra doses. What may interact with this medicine? This medicine may interact with the following medications: -colchicine -cyclosporine -other medicines for high cholesterol -some antibiotics like azithromycin, clarithromycin, erythromycin, and telithromycin This list may not describe all possible interactions. Give your health care provider a list of all the medicines, herbs, non-prescription drugs, or dietary supplements you use. Also tell them if you smoke, drink alcohol, or use illegal drugs. Some items may interact with your medicine. What should I watch for while using this medicine? Visit your doctor or health care professional for regular check-ups. You may need regular tests to make sure your liver is working properly. Your health care professional may tell you to stop taking this medicine if you develop muscle problems. If your muscle problems do not go away after stopping this medicine, contact your health care professional. Do not become pregnant while taking this medicine. Women should inform their health care professional if they wish to become pregnant or think they might be pregnant. There is a potential for serious side effects to an unborn child. Talk to your health care professional or pharmacist for more information. Do not breast-feed an infant while taking this medicine. This medicine may affect blood sugar levels. If you have diabetes, check with your doctor or health care professional before you change your diet or the dose of your diabetic medicine. If  you are going to need surgery or other procedure, tell your doctor that you are using this medicine. This drug is only part of a total heart-health program. Your doctor or a dietician can suggest a low-cholesterol and low-fat diet to help. Avoid alcohol and smoking, and keep a proper exercise schedule. This medicine may cause a decrease in Co-Enzyme Q-10. You should make sure that you get enough Co-Enzyme Q-10 while you are taking this medicine. Discuss the foods you eat and the vitamins you take with your health care professional. What side effects may I notice from receiving this medicine? Side effects that you should report to your doctor or health care professional as soon as possible: -allergic reactions like skin rash, itching or hives, swelling of the face, lips, or tongue -dark urine -fever -muscle pain, cramps, or weakness -redness, blistering, peeling or loosening of the skin, including inside the mouth -trouble passing urine or change in the amount of urine -unusually weak or tired -yellowing of the eyes or skin Side effects that usually do not require medical attention (report to your doctor or health care professional if they continue or are bothersome): -gas -headache -heartburn -indigestion -stomach pain This list may not describe all possible side effects. Call your doctor for medical advice about side effects. You may report side effects to FDA at 1-800-FDA-1088. Where should I keep my medicine? Keep out of the reach of  children. Store at room temperature between 15 to 30 degrees C (59 to 86 degrees F). Protect from light. Keep container tightly closed. Throw away any unused medicine after the expiration date. NOTE: This sheet is a summary. It may not cover all possible information. If you have questions about this medicine, talk to your doctor, pharmacist, or health care provider.  2019 [employer] (2017-03-29 12:37:09)

## 2018-10-20 MED FILL — ESOMEPRAZOLE MAG DR 40 MG C: 40 | 90 days supply | Qty: 90 | Fill #0

## 2018-10-27 MED FILL — AMLODIPINE BESYLATE 10 MG T: 10 | 90 days supply | Qty: 90 | Fill #1

## 2018-11-06 MED FILL — BENZONATATE 200 MG CAPS: 200 | 7 days supply | Qty: 20 | Fill #0

## 2018-11-10 DIAGNOSIS — S61211A Laceration without foreign body of left index finger without damage to nail, initial encounter: Secondary | ICD-10-CM | POA: Diagnosis not present

## 2018-11-10 DIAGNOSIS — Z23 Encounter for immunization: Secondary | ICD-10-CM | POA: Diagnosis not present

## 2018-11-10 MED FILL — AMOX-CLAV 875-125 MG TABLET: 875-125 | 10 days supply | Qty: 20 | Fill #0

## 2018-11-10 MED FILL — clonazePAM 0.5 MG TABS: 0.5 | 90 days supply | Qty: 90 | Fill #0

## 2019-01-18 DIAGNOSIS — L247 Irritant contact dermatitis due to plants, except food: Secondary | ICD-10-CM | POA: Diagnosis not present

## 2019-01-18 MED FILL — LEVOCETIRIZINE 5 MG TABLET: 5 | 30 days supply | Qty: 30 | Fill #0

## 2019-01-18 MED FILL — TRIAMCINOLONE 0.5% OINTMENT: 0.5 | 30 days supply | Qty: 30 | Fill #0

## 2019-02-07 MED FILL — AMLODIPINE BESYLATE 10 MG T: 10 | 90 days supply | Qty: 90 | Fill #0

## 2019-02-07 MED FILL — clonazePAM 0.5 MG TABS: 0.5 | 90 days supply | Qty: 90 | Fill #0

## 2019-03-14 MED FILL — BUTALB-ACETAMIN-CAFF 50-325: 50-325-40 | 5 days supply | Qty: 20 | Fill #0

## 2019-04-11 MED FILL — ESOMEPRAZOLE MAG DR 40 MG C: 40 | 90 days supply | Qty: 90 | Fill #0

## 2019-05-03 MED FILL — AMLODIPINE BESYLATE 10 MG T: 10 | 90 days supply | Qty: 90 | Fill #0

## 2019-05-06 MED FILL — clonazePAM 0.5 MG TABS: 0.5 | 90 days supply | Qty: 90 | Fill #1

## 2019-05-10 DIAGNOSIS — M545 Low back pain: Secondary | ICD-10-CM | POA: Diagnosis not present

## 2019-05-10 DIAGNOSIS — Z23 Encounter for immunization: Secondary | ICD-10-CM | POA: Diagnosis not present

## 2019-05-10 MED FILL — predniSONE 20 MG TABS: 20 | 5 days supply | Qty: 10 | Fill #0

## 2019-05-10 MED FILL — CELECOXIB 200 MG CAP: 200 | 30 days supply | Qty: 60 | Fill #0

## 2019-05-10 MED FILL — METHOCARBAMOL 500 MG TABS: 500 | 10 days supply | Qty: 40 | Fill #0

## 2019-06-05 MED FILL — predniSONE 20 MG TABS: 20 | 5 days supply | Qty: 10 | Fill #0

## 2019-06-05 MED FILL — LEVOCETIRIZINE 5 MG TABLET: 5 | 90 days supply | Qty: 90 | Fill #0

## 2019-06-05 MED FILL — AMOX-CLAV 875-125 MG TABLET: 875-125 | 10 days supply | Qty: 20 | Fill #0

## 2019-06-14 DIAGNOSIS — I10 Essential (primary) hypertension: Secondary | ICD-10-CM | POA: Diagnosis not present

## 2019-06-14 DIAGNOSIS — Z0000 Encounter for general adult medical examination without abnormal findings: Secondary | ICD-10-CM | POA: Diagnosis not present

## 2019-06-14 DIAGNOSIS — Z1211 Encounter for screening for malignant neoplasm of colon: Secondary | ICD-10-CM | POA: Diagnosis not present

## 2019-06-14 DIAGNOSIS — F419 Anxiety disorder, unspecified: Secondary | ICD-10-CM | POA: Diagnosis not present

## 2019-06-14 MED FILL — BUTALB-ACETAMIN-CAFF 50-325: 50-325-40 | 5 days supply | Qty: 20 | Fill #0

## 2019-06-23 MED FILL — AZITHROMYCIN 500 MG TABS: 500 | 5 days supply | Qty: 5 | Fill #0

## 2019-06-23 MED FILL — BENZONATATE 200 MG CAP: 200 | 7 days supply | Qty: 20 | Fill #0

## 2019-08-06 MED FILL — clonazePAM 0.5 MG TABS: 0.5 | 90 days supply | Qty: 90 | Fill #0

## 2019-08-06 MED FILL — AMLODIPINE BESYLATE 10 MG T: 10 | 90 days supply | Qty: 90 | Fill #0

## 2019-08-16 DIAGNOSIS — H40013 Open angle with borderline findings, low risk, bilateral: Secondary | ICD-10-CM | POA: Diagnosis not present

## 2019-10-01 MED FILL — ESOMEPRAZOLE MAG DR 40 MG C: 40 | 90 days supply | Qty: 90 | Fill #1

## 2019-11-07 MED FILL — AMLODIPINE BESYLATE 10 MG T: 10 | 90 days supply | Qty: 90 | Fill #1

## 2019-11-07 MED FILL — clonazePAM 0.5 MG TABS: 0.5 | 90 days supply | Qty: 90 | Fill #1

## 2019-12-20 DIAGNOSIS — M5432 Sciatica, left side: Secondary | ICD-10-CM | POA: Diagnosis not present

## 2019-12-20 DIAGNOSIS — M25552 Pain in left hip: Secondary | ICD-10-CM | POA: Diagnosis not present

## 2019-12-26 MED FILL — IBUPROFEN 800 MG TAB: 800 | 8 days supply | Qty: 30 | Fill #0

## 2020-02-01 MED FILL — ESOMEPRAZOLE MAG DR 40 MG C: 40 | 90 days supply | Qty: 90 | Fill #2

## 2020-02-06 MED FILL — AMLODIPINE BESYLATE 10 MG T: 10 | 90 days supply | Qty: 90 | Fill #2

## 2020-02-06 MED FILL — clonazePAM 0.5 MG TABS: 0.5 | 90 days supply | Qty: 90 | Fill #0

## 2020-03-07 DIAGNOSIS — R21 Rash and other nonspecific skin eruption: Secondary | ICD-10-CM | POA: Diagnosis not present

## 2020-03-07 DIAGNOSIS — L2389 Allergic contact dermatitis due to other agents: Secondary | ICD-10-CM | POA: Diagnosis not present

## 2020-03-07 MED FILL — hydrOXYzine HCL 25 MG TABS: 25 | 7 days supply | Qty: 21 | Fill #0

## 2020-03-07 MED FILL — PERMETHRIN 5% CREAM: 5 | 7 days supply | Qty: 60 | Fill #0

## 2020-03-13 DIAGNOSIS — L509 Urticaria, unspecified: Secondary | ICD-10-CM | POA: Diagnosis not present

## 2020-03-13 MED FILL — predniSONE 20 MG TABS: 20 | 7 days supply | Qty: 15 | Fill #0

## 2020-04-28 ENCOUNTER — Other Ambulatory Visit (HOSPITAL_COMMUNITY): Payer: Self-pay | Admitting: Family Medicine

## 2020-04-28 MED FILL — ESOMEPRAZOLE MAG DR 40 MG C: 40 | 90 days supply | Qty: 90 | Fill #0

## 2020-04-28 MED FILL — AMLODIPINE BESYLATE 10 MG T: 10 | 90 days supply | Qty: 90 | Fill #3

## 2020-04-28 MED FILL — clonazePAM 0.5 MG TABS: 0.5 | 90 days supply | Qty: 90 | Fill #1

## 2020-05-22 DIAGNOSIS — Z23 Encounter for immunization: Secondary | ICD-10-CM | POA: Diagnosis not present

## 2020-05-24 DIAGNOSIS — Z111 Encounter for screening for respiratory tuberculosis: Secondary | ICD-10-CM | POA: Diagnosis not present

## 2020-06-19 ENCOUNTER — Other Ambulatory Visit (HOSPITAL_BASED_OUTPATIENT_CLINIC_OR_DEPARTMENT_OTHER): Payer: Self-pay | Admitting: Family Medicine

## 2020-06-19 DIAGNOSIS — E291 Testicular hypofunction: Secondary | ICD-10-CM | POA: Diagnosis not present

## 2020-06-19 DIAGNOSIS — Z1211 Encounter for screening for malignant neoplasm of colon: Secondary | ICD-10-CM | POA: Diagnosis not present

## 2020-06-19 DIAGNOSIS — Z125 Encounter for screening for malignant neoplasm of prostate: Secondary | ICD-10-CM | POA: Diagnosis not present

## 2020-06-19 DIAGNOSIS — Z79899 Other long term (current) drug therapy: Secondary | ICD-10-CM | POA: Diagnosis not present

## 2020-06-19 DIAGNOSIS — Z13220 Encounter for screening for lipoid disorders: Secondary | ICD-10-CM | POA: Diagnosis not present

## 2020-06-19 DIAGNOSIS — F419 Anxiety disorder, unspecified: Secondary | ICD-10-CM | POA: Diagnosis not present

## 2020-06-19 DIAGNOSIS — R8290 Unspecified abnormal findings in urine: Secondary | ICD-10-CM | POA: Diagnosis not present

## 2020-06-19 DIAGNOSIS — Z0000 Encounter for general adult medical examination without abnormal findings: Secondary | ICD-10-CM | POA: Diagnosis not present

## 2020-06-19 DIAGNOSIS — I10 Essential (primary) hypertension: Secondary | ICD-10-CM | POA: Diagnosis not present

## 2020-07-23 ENCOUNTER — Other Ambulatory Visit (HOSPITAL_BASED_OUTPATIENT_CLINIC_OR_DEPARTMENT_OTHER): Payer: Self-pay | Admitting: Family Medicine

## 2020-07-23 MED FILL — AMLODIPINE BESYLATE 10 MG T: 10 | 90 days supply | Qty: 90 | Fill #0

## 2020-07-23 MED FILL — clonazePAM 0.5 MG TABS: 0.5 | 90 days supply | Qty: 90 | Fill #0

## 2020-07-28 MED FILL — ESOMEPRAZOLE MAG DR 40 MG C: 40 | 90 days supply | Qty: 90 | Fill #1

## 2020-10-07 DIAGNOSIS — H40013 Open angle with borderline findings, low risk, bilateral: Secondary | ICD-10-CM | POA: Diagnosis not present

## 2020-10-28 MED FILL — clonazePAM 0.5 MG TABS: 0.5 | 90 days supply | Qty: 90 | Fill #1

## 2020-10-28 MED FILL — AMLODIPINE BESYLATE 10 MG T: 10 | 90 days supply | Qty: 90 | Fill #1

## 2021-01-21 ENCOUNTER — Other Ambulatory Visit (HOSPITAL_COMMUNITY): Payer: Self-pay

## 2021-01-21 MED ORDER — CLONAZEPAM 0.5 MG PO TABS
0.2500 mg | ORAL_TABLET | Freq: Two times a day (BID) | ORAL | 1 refills | Status: DC | PRN
  Filled 2021-01-21 (×2): qty 90, 90d supply, fill #0
  Filled 2021-04-26: qty 90, 90d supply, fill #1

## 2021-01-21 MED FILL — Esomeprazole Magnesium Cap Delayed Release 40 MG (Base Eq): ORAL | 90 days supply | Qty: 90 | Fill #0 | Status: AC

## 2021-01-21 MED FILL — Amlodipine Besylate Tab 10 MG (Base Equivalent): ORAL | 90 days supply | Qty: 90 | Fill #0 | Status: AC

## 2021-01-23 ENCOUNTER — Other Ambulatory Visit (HOSPITAL_COMMUNITY): Payer: Self-pay

## 2021-03-19 ENCOUNTER — Other Ambulatory Visit (HOSPITAL_COMMUNITY): Payer: Self-pay

## 2021-03-19 DIAGNOSIS — J40 Bronchitis, not specified as acute or chronic: Secondary | ICD-10-CM | POA: Diagnosis not present

## 2021-03-19 MED ORDER — AZITHROMYCIN 500 MG PO TABS
500.0000 mg | ORAL_TABLET | Freq: Every day | ORAL | 0 refills | Status: AC
  Filled 2021-03-19: qty 5, 5d supply, fill #0

## 2021-04-03 ENCOUNTER — Other Ambulatory Visit (HOSPITAL_COMMUNITY): Payer: Self-pay

## 2021-04-03 MED ORDER — BENZONATATE 200 MG PO CAPS
200.0000 mg | ORAL_CAPSULE | Freq: Three times a day (TID) | ORAL | 0 refills | Status: AC | PRN
  Filled 2021-04-03: qty 20, 7d supply, fill #0

## 2021-04-26 MED FILL — Amlodipine Besylate Tab 10 MG (Base Equivalent): ORAL | 90 days supply | Qty: 90 | Fill #1 | Status: AC

## 2021-04-26 MED FILL — Esomeprazole Magnesium Cap Delayed Release 40 MG (Base Eq): ORAL | 90 days supply | Qty: 90 | Fill #1 | Status: AC

## 2021-04-27 ENCOUNTER — Other Ambulatory Visit (HOSPITAL_COMMUNITY): Payer: Self-pay

## 2021-04-27 ENCOUNTER — Other Ambulatory Visit (HOSPITAL_BASED_OUTPATIENT_CLINIC_OR_DEPARTMENT_OTHER): Payer: Self-pay

## 2021-05-25 DIAGNOSIS — Z23 Encounter for immunization: Secondary | ICD-10-CM | POA: Diagnosis not present

## 2021-05-25 DIAGNOSIS — Z111 Encounter for screening for respiratory tuberculosis: Secondary | ICD-10-CM | POA: Diagnosis not present

## 2021-06-30 ENCOUNTER — Other Ambulatory Visit (HOSPITAL_BASED_OUTPATIENT_CLINIC_OR_DEPARTMENT_OTHER): Payer: Self-pay

## 2021-07-14 ENCOUNTER — Other Ambulatory Visit (HOSPITAL_COMMUNITY): Payer: Self-pay

## 2021-07-14 DIAGNOSIS — K219 Gastro-esophageal reflux disease without esophagitis: Secondary | ICD-10-CM | POA: Diagnosis not present

## 2021-07-14 DIAGNOSIS — Z0000 Encounter for general adult medical examination without abnormal findings: Secondary | ICD-10-CM | POA: Diagnosis not present

## 2021-07-14 DIAGNOSIS — F419 Anxiety disorder, unspecified: Secondary | ICD-10-CM | POA: Diagnosis not present

## 2021-07-14 DIAGNOSIS — Z13220 Encounter for screening for lipoid disorders: Secondary | ICD-10-CM | POA: Diagnosis not present

## 2021-07-14 DIAGNOSIS — I10 Essential (primary) hypertension: Secondary | ICD-10-CM | POA: Diagnosis not present

## 2021-07-14 DIAGNOSIS — Z125 Encounter for screening for malignant neoplasm of prostate: Secondary | ICD-10-CM | POA: Diagnosis not present

## 2021-07-14 MED ORDER — ESOMEPRAZOLE MAGNESIUM 40 MG PO CPDR
40.0000 mg | DELAYED_RELEASE_CAPSULE | Freq: Every day | ORAL | 3 refills | Status: DC
  Filled 2021-07-14: qty 90, 90d supply, fill #0
  Filled 2021-10-12: qty 90, 90d supply, fill #1
  Filled 2022-01-09: qty 90, 90d supply, fill #2
  Filled 2022-04-20: qty 90, 90d supply, fill #3

## 2021-07-14 MED ORDER — CLONAZEPAM 0.5 MG PO TABS
0.2500 mg | ORAL_TABLET | Freq: Two times a day (BID) | ORAL | 1 refills | Status: AC | PRN
  Filled 2021-07-14: qty 90, 90d supply, fill #0

## 2021-07-14 MED ORDER — AMLODIPINE BESYLATE 10 MG PO TABS
10.0000 mg | ORAL_TABLET | Freq: Every day | ORAL | 3 refills | Status: AC
  Filled 2021-07-14: qty 90, 90d supply, fill #0
  Filled 2021-10-12: qty 90, 90d supply, fill #1
  Filled 2022-01-09: qty 90, 90d supply, fill #2
  Filled 2022-04-09: qty 90, 90d supply, fill #3

## 2021-07-19 DIAGNOSIS — J0100 Acute maxillary sinusitis, unspecified: Secondary | ICD-10-CM | POA: Diagnosis not present

## 2021-07-27 ENCOUNTER — Other Ambulatory Visit (HOSPITAL_COMMUNITY): Payer: Self-pay

## 2021-07-27 MED ORDER — CLONAZEPAM 0.5 MG PO TABS
0.5000 mg | ORAL_TABLET | ORAL | 1 refills | Status: DC
  Filled 2021-07-27: qty 90, 90d supply, fill #0
  Filled 2021-10-26: qty 90, 90d supply, fill #1

## 2021-10-12 ENCOUNTER — Other Ambulatory Visit (HOSPITAL_COMMUNITY): Payer: Self-pay

## 2021-10-26 ENCOUNTER — Other Ambulatory Visit (HOSPITAL_COMMUNITY): Payer: Self-pay

## 2021-10-28 DIAGNOSIS — H40013 Open angle with borderline findings, low risk, bilateral: Secondary | ICD-10-CM | POA: Diagnosis not present

## 2022-01-11 ENCOUNTER — Other Ambulatory Visit (HOSPITAL_COMMUNITY): Payer: Self-pay

## 2022-01-12 ENCOUNTER — Other Ambulatory Visit (HOSPITAL_COMMUNITY): Payer: Self-pay

## 2022-01-22 ENCOUNTER — Other Ambulatory Visit (HOSPITAL_COMMUNITY): Payer: Self-pay

## 2022-01-22 MED ORDER — CLONAZEPAM 0.5 MG PO TABS
0.2500 mg | ORAL_TABLET | Freq: Two times a day (BID) | ORAL | 1 refills | Status: DC | PRN
  Filled 2022-01-22: qty 90, 90d supply, fill #0
  Filled 2022-04-14 – 2022-04-20 (×2): qty 90, 90d supply, fill #1

## 2022-03-18 DIAGNOSIS — S40862A Insect bite (nonvenomous) of left upper arm, initial encounter: Secondary | ICD-10-CM | POA: Diagnosis not present

## 2022-03-18 DIAGNOSIS — W57XXXA Bitten or stung by nonvenomous insect and other nonvenomous arthropods, initial encounter: Secondary | ICD-10-CM | POA: Diagnosis not present

## 2022-04-09 ENCOUNTER — Other Ambulatory Visit (HOSPITAL_COMMUNITY): Payer: Self-pay

## 2022-04-14 ENCOUNTER — Other Ambulatory Visit (HOSPITAL_COMMUNITY): Payer: Self-pay

## 2022-04-20 ENCOUNTER — Other Ambulatory Visit (HOSPITAL_COMMUNITY): Payer: Self-pay

## 2022-07-07 DIAGNOSIS — R1032 Left lower quadrant pain: Secondary | ICD-10-CM | POA: Diagnosis not present

## 2022-07-08 ENCOUNTER — Other Ambulatory Visit (HOSPITAL_COMMUNITY): Payer: Self-pay

## 2022-07-08 MED ORDER — IBUPROFEN 800 MG PO TABS
800.0000 mg | ORAL_TABLET | Freq: Three times a day (TID) | ORAL | 0 refills | Status: AC | PRN
  Filled 2022-07-08: qty 30, 10d supply, fill #0

## 2022-07-13 ENCOUNTER — Other Ambulatory Visit (HOSPITAL_COMMUNITY): Payer: Self-pay

## 2022-07-13 DIAGNOSIS — F419 Anxiety disorder, unspecified: Secondary | ICD-10-CM | POA: Diagnosis not present

## 2022-07-13 DIAGNOSIS — Z0000 Encounter for general adult medical examination without abnormal findings: Secondary | ICD-10-CM | POA: Diagnosis not present

## 2022-07-13 DIAGNOSIS — Z1211 Encounter for screening for malignant neoplasm of colon: Secondary | ICD-10-CM | POA: Diagnosis not present

## 2022-07-13 DIAGNOSIS — K219 Gastro-esophageal reflux disease without esophagitis: Secondary | ICD-10-CM | POA: Diagnosis not present

## 2022-07-13 DIAGNOSIS — I10 Essential (primary) hypertension: Secondary | ICD-10-CM | POA: Diagnosis not present

## 2022-07-13 MED ORDER — CLONAZEPAM 0.5 MG PO TABS
0.2500 mg | ORAL_TABLET | Freq: Two times a day (BID) | ORAL | 1 refills | Status: AC | PRN
  Filled 2022-07-26: qty 90, 90d supply, fill #0
  Filled 2022-10-10: qty 90, 90d supply, fill #1

## 2022-07-13 MED ORDER — MELOXICAM 15 MG PO TABS
15.0000 mg | ORAL_TABLET | Freq: Every day | ORAL | 0 refills | Status: AC
  Filled 2022-07-13: qty 30, 30d supply, fill #0

## 2022-07-13 MED ORDER — AMLODIPINE BESYLATE 10 MG PO TABS
10.0000 mg | ORAL_TABLET | Freq: Every day | ORAL | 3 refills | Status: DC
  Filled 2022-07-13: qty 90, 90d supply, fill #0
  Filled 2022-10-10: qty 90, 90d supply, fill #1
  Filled 2023-01-06: qty 90, 90d supply, fill #2
  Filled 2023-04-11: qty 90, 90d supply, fill #3

## 2022-07-13 MED ORDER — ESOMEPRAZOLE MAGNESIUM 40 MG PO CPDR
40.0000 mg | DELAYED_RELEASE_CAPSULE | Freq: Every day | ORAL | 3 refills | Status: AC
Start: 2022-07-13 — End: ?
  Filled 2022-07-13: qty 90, 90d supply, fill #0
  Filled 2022-10-20: qty 90, 90d supply, fill #1
  Filled 2023-03-28: qty 90, 90d supply, fill #2

## 2022-07-14 ENCOUNTER — Other Ambulatory Visit (HOSPITAL_COMMUNITY): Payer: Self-pay

## 2022-07-14 MED ORDER — CLONAZEPAM 0.5 MG PO TABS
0.2500 mg | ORAL_TABLET | Freq: Two times a day (BID) | ORAL | 1 refills | Status: DC | PRN
  Filled 2022-10-12 (×2): qty 90, 90d supply, fill #0

## 2022-07-14 MED ORDER — PRAVASTATIN SODIUM 10 MG PO TABS
10.0000 mg | ORAL_TABLET | Freq: Every day | ORAL | 3 refills | Status: AC
  Filled 2022-07-14: qty 90, 90d supply, fill #0

## 2022-07-16 ENCOUNTER — Other Ambulatory Visit (HOSPITAL_COMMUNITY): Payer: Self-pay

## 2022-07-26 ENCOUNTER — Other Ambulatory Visit (HOSPITAL_COMMUNITY): Payer: Self-pay

## 2022-07-26 ENCOUNTER — Other Ambulatory Visit: Payer: Self-pay

## 2022-08-18 DIAGNOSIS — F419 Anxiety disorder, unspecified: Secondary | ICD-10-CM | POA: Diagnosis not present

## 2022-08-18 DIAGNOSIS — M25552 Pain in left hip: Secondary | ICD-10-CM | POA: Diagnosis not present

## 2022-08-18 DIAGNOSIS — I10 Essential (primary) hypertension: Secondary | ICD-10-CM | POA: Diagnosis not present

## 2022-08-24 ENCOUNTER — Other Ambulatory Visit (HOSPITAL_COMMUNITY): Payer: Self-pay

## 2022-08-24 DIAGNOSIS — S76212A Strain of adductor muscle, fascia and tendon of left thigh, initial encounter: Secondary | ICD-10-CM | POA: Diagnosis not present

## 2022-08-24 MED ORDER — PREDNISONE 10 MG (48) PO TBPK
ORAL_TABLET | ORAL | 0 refills | Status: AC
  Filled 2022-08-24: qty 48, 12d supply, fill #0

## 2022-08-27 ENCOUNTER — Other Ambulatory Visit (HOSPITAL_COMMUNITY): Payer: Self-pay

## 2022-09-06 ENCOUNTER — Other Ambulatory Visit (HOSPITAL_BASED_OUTPATIENT_CLINIC_OR_DEPARTMENT_OTHER): Payer: Self-pay

## 2022-10-11 ENCOUNTER — Other Ambulatory Visit: Payer: Self-pay

## 2022-10-12 ENCOUNTER — Other Ambulatory Visit (HOSPITAL_COMMUNITY): Payer: Self-pay

## 2022-10-20 ENCOUNTER — Other Ambulatory Visit (HOSPITAL_COMMUNITY): Payer: Self-pay

## 2022-12-09 DIAGNOSIS — H40013 Open angle with borderline findings, low risk, bilateral: Secondary | ICD-10-CM | POA: Diagnosis not present

## 2022-12-29 ENCOUNTER — Other Ambulatory Visit (HOSPITAL_COMMUNITY): Payer: Self-pay

## 2022-12-29 MED ORDER — ESOMEPRAZOLE MAGNESIUM 40 MG PO CPDR
40.0000 mg | DELAYED_RELEASE_CAPSULE | Freq: Every day | ORAL | 3 refills | Status: DC
  Filled 2022-12-29: qty 90, 90d supply, fill #0
  Filled 2023-04-22: qty 90, 90d supply, fill #1
  Filled 2023-10-05: qty 90, 90d supply, fill #2

## 2023-01-06 ENCOUNTER — Other Ambulatory Visit (HOSPITAL_COMMUNITY): Payer: Self-pay

## 2023-01-11 ENCOUNTER — Other Ambulatory Visit (HOSPITAL_COMMUNITY): Payer: Self-pay

## 2023-01-12 ENCOUNTER — Other Ambulatory Visit (HOSPITAL_COMMUNITY): Payer: Self-pay

## 2023-01-12 MED ORDER — CLONAZEPAM 0.5 MG PO TABS
0.2500 mg | ORAL_TABLET | Freq: Two times a day (BID) | ORAL | 1 refills | Status: DC | PRN
  Filled 2023-01-12: qty 90, 90d supply, fill #0
  Filled 2023-04-11: qty 90, 90d supply, fill #1

## 2023-01-17 ENCOUNTER — Other Ambulatory Visit (HOSPITAL_COMMUNITY): Payer: Self-pay

## 2023-03-11 ENCOUNTER — Other Ambulatory Visit (HOSPITAL_BASED_OUTPATIENT_CLINIC_OR_DEPARTMENT_OTHER): Payer: Self-pay

## 2023-03-11 MED ORDER — PREDNISONE 10 MG PO TABS
ORAL_TABLET | ORAL | 0 refills | Status: AC
  Filled 2023-03-11: qty 21, 6d supply, fill #0

## 2023-03-28 ENCOUNTER — Other Ambulatory Visit (HOSPITAL_COMMUNITY): Payer: Self-pay

## 2023-04-07 ENCOUNTER — Other Ambulatory Visit (HOSPITAL_COMMUNITY): Payer: Self-pay

## 2023-04-12 ENCOUNTER — Other Ambulatory Visit: Payer: Self-pay

## 2023-04-13 ENCOUNTER — Other Ambulatory Visit (HOSPITAL_COMMUNITY): Payer: Self-pay

## 2023-04-22 ENCOUNTER — Other Ambulatory Visit (HOSPITAL_COMMUNITY): Payer: Self-pay

## 2023-04-25 ENCOUNTER — Other Ambulatory Visit (HOSPITAL_COMMUNITY): Payer: Self-pay

## 2023-05-02 DIAGNOSIS — Z23 Encounter for immunization: Secondary | ICD-10-CM | POA: Diagnosis not present

## 2023-07-10 ENCOUNTER — Other Ambulatory Visit (HOSPITAL_COMMUNITY): Payer: Self-pay

## 2023-07-11 ENCOUNTER — Other Ambulatory Visit (HOSPITAL_COMMUNITY): Payer: Self-pay

## 2023-07-11 MED ORDER — AMLODIPINE BESYLATE 10 MG PO TABS
10.0000 mg | ORAL_TABLET | Freq: Every day | ORAL | 3 refills | Status: DC
  Filled 2023-07-11: qty 90, 90d supply, fill #0
  Filled 2023-10-05: qty 90, 90d supply, fill #1
  Filled 2024-01-03: qty 90, 90d supply, fill #2
  Filled 2024-04-06: qty 90, 90d supply, fill #0

## 2023-07-11 MED ORDER — CLONAZEPAM 0.5 MG PO TABS
0.2500 mg | ORAL_TABLET | Freq: Two times a day (BID) | ORAL | 1 refills | Status: DC | PRN
  Filled 2023-07-11 – 2023-10-10 (×3): qty 90, 90d supply, fill #0

## 2023-07-13 ENCOUNTER — Other Ambulatory Visit (HOSPITAL_COMMUNITY): Payer: Self-pay

## 2023-07-20 DIAGNOSIS — Z0000 Encounter for general adult medical examination without abnormal findings: Secondary | ICD-10-CM | POA: Diagnosis not present

## 2023-08-24 ENCOUNTER — Other Ambulatory Visit (HOSPITAL_COMMUNITY): Payer: Self-pay

## 2023-09-20 DIAGNOSIS — D179 Benign lipomatous neoplasm, unspecified: Secondary | ICD-10-CM | POA: Diagnosis not present

## 2023-10-05 ENCOUNTER — Other Ambulatory Visit (HOSPITAL_BASED_OUTPATIENT_CLINIC_OR_DEPARTMENT_OTHER): Payer: Self-pay

## 2023-10-10 ENCOUNTER — Other Ambulatory Visit (HOSPITAL_BASED_OUTPATIENT_CLINIC_OR_DEPARTMENT_OTHER): Payer: Self-pay

## 2023-11-11 ENCOUNTER — Other Ambulatory Visit (HOSPITAL_COMMUNITY): Payer: Self-pay

## 2023-11-11 DIAGNOSIS — J309 Allergic rhinitis, unspecified: Secondary | ICD-10-CM | POA: Diagnosis not present

## 2023-11-11 MED ORDER — AMOXICILLIN-POT CLAVULANATE 875-125 MG PO TABS
1.0000 | ORAL_TABLET | Freq: Two times a day (BID) | ORAL | 0 refills | Status: AC
  Filled 2023-11-11: qty 20, 10d supply, fill #0

## 2023-12-12 DIAGNOSIS — H40013 Open angle with borderline findings, low risk, bilateral: Secondary | ICD-10-CM | POA: Diagnosis not present

## 2023-12-23 ENCOUNTER — Other Ambulatory Visit (HOSPITAL_COMMUNITY): Payer: Self-pay

## 2023-12-23 DIAGNOSIS — J988 Other specified respiratory disorders: Secondary | ICD-10-CM | POA: Diagnosis not present

## 2023-12-23 MED ORDER — CEFDINIR 300 MG PO CAPS
300.0000 mg | ORAL_CAPSULE | Freq: Two times a day (BID) | ORAL | 0 refills | Status: AC
  Filled 2023-12-23: qty 20, 10d supply, fill #0

## 2023-12-23 MED ORDER — BENZONATATE 200 MG PO CAPS
200.0000 mg | ORAL_CAPSULE | Freq: Three times a day (TID) | ORAL | 0 refills | Status: AC
  Filled 2023-12-23: qty 20, 7d supply, fill #0

## 2023-12-23 MED ORDER — FLUTICASONE PROPIONATE 50 MCG/ACT NA SUSP
1.0000 | Freq: Every day | NASAL | 1 refills | Status: DC
  Filled 2023-12-23: qty 16, 60d supply, fill #0

## 2023-12-23 MED ORDER — ALBUTEROL SULFATE HFA 108 (90 BASE) MCG/ACT IN AERS
INHALATION_SPRAY | RESPIRATORY_TRACT | 0 refills | Status: AC
  Filled 2023-12-23: qty 6.7, 25d supply, fill #0

## 2023-12-23 MED ORDER — PREDNISONE 20 MG PO TABS
20.0000 mg | ORAL_TABLET | Freq: Two times a day (BID) | ORAL | 0 refills | Status: AC
Start: 2023-12-23 — End: ?
  Filled 2023-12-23: qty 10, 5d supply, fill #0

## 2024-01-03 ENCOUNTER — Other Ambulatory Visit (HOSPITAL_BASED_OUTPATIENT_CLINIC_OR_DEPARTMENT_OTHER): Payer: Self-pay

## 2024-01-03 ENCOUNTER — Other Ambulatory Visit: Payer: Self-pay

## 2024-01-03 MED ORDER — ESOMEPRAZOLE MAGNESIUM 40 MG PO CPDR
40.0000 mg | DELAYED_RELEASE_CAPSULE | Freq: Every day | ORAL | 3 refills | Status: AC
  Filled 2024-01-03: qty 90, 90d supply, fill #0

## 2024-01-03 MED ORDER — CLONAZEPAM 0.5 MG PO TABS
0.2500 mg | ORAL_TABLET | Freq: Two times a day (BID) | ORAL | 1 refills | Status: DC | PRN
  Filled 2024-01-03 – 2024-01-06 (×2): qty 90, 90d supply, fill #0
  Filled 2024-04-06: qty 90, 90d supply, fill #1
  Filled ????-??-??: fill #0

## 2024-01-04 ENCOUNTER — Other Ambulatory Visit (HOSPITAL_COMMUNITY): Payer: Self-pay

## 2024-01-04 ENCOUNTER — Other Ambulatory Visit (HOSPITAL_BASED_OUTPATIENT_CLINIC_OR_DEPARTMENT_OTHER): Payer: Self-pay

## 2024-01-06 ENCOUNTER — Other Ambulatory Visit (HOSPITAL_COMMUNITY): Payer: Self-pay

## 2024-02-06 ENCOUNTER — Other Ambulatory Visit (HOSPITAL_COMMUNITY): Payer: Self-pay

## 2024-02-06 DIAGNOSIS — M25462 Effusion, left knee: Secondary | ICD-10-CM | POA: Diagnosis not present

## 2024-02-06 DIAGNOSIS — M25562 Pain in left knee: Secondary | ICD-10-CM | POA: Diagnosis not present

## 2024-02-06 DIAGNOSIS — S8992XA Unspecified injury of left lower leg, initial encounter: Secondary | ICD-10-CM | POA: Diagnosis not present

## 2024-02-06 MED ORDER — CELECOXIB 200 MG PO CAPS
200.0000 mg | ORAL_CAPSULE | Freq: Two times a day (BID) | ORAL | 0 refills | Status: AC
  Filled 2024-02-06 (×2): qty 60, 30d supply, fill #0

## 2024-02-18 ENCOUNTER — Other Ambulatory Visit (HOSPITAL_COMMUNITY): Payer: Self-pay

## 2024-02-29 DIAGNOSIS — F19229 Other psychoactive substance dependence with intoxication, unspecified: Secondary | ICD-10-CM | POA: Diagnosis not present

## 2024-03-06 DIAGNOSIS — F19229 Other psychoactive substance dependence with intoxication, unspecified: Secondary | ICD-10-CM | POA: Diagnosis not present

## 2024-03-16 DIAGNOSIS — F19229 Other psychoactive substance dependence with intoxication, unspecified: Secondary | ICD-10-CM | POA: Diagnosis not present

## 2024-03-20 DIAGNOSIS — F19229 Other psychoactive substance dependence with intoxication, unspecified: Secondary | ICD-10-CM | POA: Diagnosis not present

## 2024-03-30 DIAGNOSIS — F19229 Other psychoactive substance dependence with intoxication, unspecified: Secondary | ICD-10-CM | POA: Diagnosis not present

## 2024-04-02 DIAGNOSIS — F19229 Other psychoactive substance dependence with intoxication, unspecified: Secondary | ICD-10-CM | POA: Diagnosis not present

## 2024-04-06 ENCOUNTER — Other Ambulatory Visit (HOSPITAL_BASED_OUTPATIENT_CLINIC_OR_DEPARTMENT_OTHER): Payer: Self-pay

## 2024-04-06 ENCOUNTER — Other Ambulatory Visit (HOSPITAL_COMMUNITY): Payer: Self-pay

## 2024-04-06 ENCOUNTER — Other Ambulatory Visit: Payer: Self-pay

## 2024-04-07 ENCOUNTER — Other Ambulatory Visit (HOSPITAL_COMMUNITY): Payer: Self-pay

## 2024-04-16 DIAGNOSIS — F19229 Other psychoactive substance dependence with intoxication, unspecified: Secondary | ICD-10-CM | POA: Diagnosis not present

## 2024-04-24 ENCOUNTER — Other Ambulatory Visit (HOSPITAL_BASED_OUTPATIENT_CLINIC_OR_DEPARTMENT_OTHER): Payer: Self-pay

## 2024-04-24 MED ORDER — FLUZONE 0.5 ML IM SUSY
0.5000 mL | PREFILLED_SYRINGE | Freq: Once | INTRAMUSCULAR | 0 refills | Status: AC
  Filled 2024-04-24: qty 0.5, 1d supply, fill #0

## 2024-04-26 DIAGNOSIS — F19229 Other psychoactive substance dependence with intoxication, unspecified: Secondary | ICD-10-CM | POA: Diagnosis not present

## 2024-05-02 DIAGNOSIS — L820 Inflamed seborrheic keratosis: Secondary | ICD-10-CM | POA: Diagnosis not present

## 2024-05-02 DIAGNOSIS — D239 Other benign neoplasm of skin, unspecified: Secondary | ICD-10-CM | POA: Diagnosis not present

## 2024-05-02 DIAGNOSIS — D235 Other benign neoplasm of skin of trunk: Secondary | ICD-10-CM | POA: Diagnosis not present

## 2024-05-02 DIAGNOSIS — D485 Neoplasm of uncertain behavior of skin: Secondary | ICD-10-CM | POA: Diagnosis not present

## 2024-05-15 DIAGNOSIS — F19229 Other psychoactive substance dependence with intoxication, unspecified: Secondary | ICD-10-CM | POA: Diagnosis not present

## 2024-07-04 ENCOUNTER — Other Ambulatory Visit (HOSPITAL_BASED_OUTPATIENT_CLINIC_OR_DEPARTMENT_OTHER): Payer: Self-pay

## 2024-07-04 ENCOUNTER — Other Ambulatory Visit: Payer: Self-pay

## 2024-07-04 ENCOUNTER — Other Ambulatory Visit (HOSPITAL_COMMUNITY): Payer: Self-pay

## 2024-07-04 MED ORDER — AMLODIPINE BESYLATE 10 MG PO TABS
10.0000 mg | ORAL_TABLET | Freq: Every day | ORAL | 3 refills | Status: AC
  Filled 2024-07-04 – 2024-09-10 (×3): qty 90, 90d supply, fill #0

## 2024-07-04 MED ORDER — CLONAZEPAM 0.5 MG PO TABS
0.2500 mg | ORAL_TABLET | Freq: Two times a day (BID) | ORAL | 1 refills | Status: DC | PRN
  Filled 2024-07-04 – 2024-07-06 (×2): qty 90, 90d supply, fill #0

## 2024-07-06 ENCOUNTER — Other Ambulatory Visit (HOSPITAL_BASED_OUTPATIENT_CLINIC_OR_DEPARTMENT_OTHER): Payer: Self-pay

## 2024-07-06 ENCOUNTER — Other Ambulatory Visit: Payer: Self-pay

## 2024-07-13 ENCOUNTER — Other Ambulatory Visit (HOSPITAL_COMMUNITY): Payer: Self-pay

## 2024-07-19 DIAGNOSIS — F19229 Other psychoactive substance dependence with intoxication, unspecified: Secondary | ICD-10-CM | POA: Diagnosis not present

## 2024-07-31 ENCOUNTER — Other Ambulatory Visit (HOSPITAL_COMMUNITY): Payer: Self-pay

## 2024-08-03 DIAGNOSIS — F19229 Other psychoactive substance dependence with intoxication, unspecified: Secondary | ICD-10-CM | POA: Diagnosis not present

## 2024-08-30 ENCOUNTER — Other Ambulatory Visit (HOSPITAL_COMMUNITY): Payer: Self-pay

## 2024-08-30 MED ORDER — CLONAZEPAM 0.5 MG PO TABS
0.5000 mg | ORAL_TABLET | Freq: Two times a day (BID) | ORAL | 1 refills | Status: AC | PRN
  Filled 2024-08-30 – 2024-09-13 (×2): qty 90, 45d supply, fill #0

## 2024-09-10 ENCOUNTER — Other Ambulatory Visit (HOSPITAL_COMMUNITY): Payer: Self-pay

## 2024-09-11 ENCOUNTER — Other Ambulatory Visit (HOSPITAL_COMMUNITY): Payer: Self-pay

## 2024-09-13 ENCOUNTER — Other Ambulatory Visit (HOSPITAL_COMMUNITY): Payer: Self-pay
